# Patient Record
Sex: Female | Born: 1945 | Race: White | Hispanic: No | Marital: Married | Smoking: Never smoker
Health system: Southern US, Community
[De-identification: ages and names within clinical notes are randomized; demographics above are authoritative.]

## PROBLEM LIST (undated history)

## (undated) DIAGNOSIS — M1990 Unspecified osteoarthritis, unspecified site: Secondary | ICD-10-CM

## (undated) DIAGNOSIS — Z9861 Coronary angioplasty status: Secondary | ICD-10-CM

## (undated) DIAGNOSIS — I2510 Atherosclerotic heart disease of native coronary artery without angina pectoris: Secondary | ICD-10-CM

## (undated) DIAGNOSIS — E785 Hyperlipidemia, unspecified: Secondary | ICD-10-CM

## (undated) DIAGNOSIS — I10 Essential (primary) hypertension: Secondary | ICD-10-CM

## (undated) HISTORY — DX: Atherosclerotic heart disease of native coronary artery without angina pectoris: I25.10

## (undated) HISTORY — DX: Coronary angioplasty status: Z98.61

## (undated) HISTORY — PX: CARDIAC CATHETERIZATION: SHX172

## (undated) HISTORY — DX: Essential (primary) hypertension: I10

## (undated) HISTORY — DX: Hyperlipidemia, unspecified: E78.5

---

## 1999-03-22 ENCOUNTER — Other Ambulatory Visit: Admission: RE | Admit: 1999-03-22 | Discharge: 1999-03-22 | Payer: Self-pay | Admitting: *Deleted

## 1999-06-13 ENCOUNTER — Ambulatory Visit (HOSPITAL_COMMUNITY): Admission: AD | Admit: 1999-06-13 | Discharge: 1999-06-13 | Payer: Self-pay | Admitting: Cardiovascular Disease

## 1999-06-29 ENCOUNTER — Encounter: Payer: Self-pay | Admitting: Surgery

## 1999-07-03 ENCOUNTER — Encounter: Payer: Self-pay | Admitting: Surgery

## 1999-07-03 ENCOUNTER — Inpatient Hospital Stay (HOSPITAL_COMMUNITY): Admission: RE | Admit: 1999-07-03 | Discharge: 1999-07-07 | Payer: Self-pay | Admitting: Surgery

## 1999-07-04 ENCOUNTER — Encounter: Payer: Self-pay | Admitting: Surgery

## 1999-07-05 ENCOUNTER — Encounter: Payer: Self-pay | Admitting: Surgery

## 1999-10-31 ENCOUNTER — Encounter (HOSPITAL_COMMUNITY): Admission: RE | Admit: 1999-10-31 | Discharge: 2000-01-29 | Payer: Self-pay | Admitting: Cardiovascular Disease

## 2000-04-15 ENCOUNTER — Other Ambulatory Visit: Admission: RE | Admit: 2000-04-15 | Discharge: 2000-04-15 | Payer: Self-pay | Admitting: *Deleted

## 2000-09-04 ENCOUNTER — Encounter: Payer: Self-pay | Admitting: Emergency Medicine

## 2000-09-04 ENCOUNTER — Emergency Department (HOSPITAL_COMMUNITY): Admission: EM | Admit: 2000-09-04 | Discharge: 2000-09-04 | Payer: Self-pay | Admitting: Emergency Medicine

## 2001-05-08 ENCOUNTER — Other Ambulatory Visit: Admission: RE | Admit: 2001-05-08 | Discharge: 2001-05-08 | Payer: Self-pay | Admitting: *Deleted

## 2001-06-12 ENCOUNTER — Encounter (INDEPENDENT_AMBULATORY_CARE_PROVIDER_SITE_OTHER): Payer: Self-pay

## 2001-06-12 ENCOUNTER — Other Ambulatory Visit: Admission: RE | Admit: 2001-06-12 | Discharge: 2001-06-12 | Payer: Self-pay | Admitting: *Deleted

## 2003-10-20 ENCOUNTER — Ambulatory Visit (HOSPITAL_COMMUNITY): Admission: RE | Admit: 2003-10-20 | Discharge: 2003-10-21 | Payer: Self-pay | Admitting: Cardiovascular Disease

## 2006-10-02 ENCOUNTER — Ambulatory Visit (HOSPITAL_COMMUNITY): Admission: RE | Admit: 2006-10-02 | Discharge: 2006-10-03 | Payer: Self-pay | Admitting: Cardiovascular Disease

## 2006-10-30 ENCOUNTER — Emergency Department (HOSPITAL_COMMUNITY): Admission: EM | Admit: 2006-10-30 | Discharge: 2006-10-30 | Payer: Self-pay | Admitting: Emergency Medicine

## 2009-05-17 ENCOUNTER — Other Ambulatory Visit: Admission: RE | Admit: 2009-05-17 | Discharge: 2009-05-17 | Payer: Self-pay | Admitting: Family Medicine

## 2010-10-24 ENCOUNTER — Ambulatory Visit: Payer: Self-pay | Admitting: Cardiovascular Disease

## 2011-01-10 ENCOUNTER — Ambulatory Visit: Payer: Self-pay | Admitting: Cardiovascular Disease

## 2011-01-10 ENCOUNTER — Telehealth (INDEPENDENT_AMBULATORY_CARE_PROVIDER_SITE_OTHER): Payer: Self-pay | Admitting: *Deleted

## 2011-01-11 ENCOUNTER — Ambulatory Visit: Admission: RE | Admit: 2011-01-11 | Discharge: 2011-01-11 | Payer: Self-pay | Source: Home / Self Care

## 2011-01-11 ENCOUNTER — Encounter (HOSPITAL_COMMUNITY)
Admission: RE | Admit: 2011-01-11 | Discharge: 2011-01-16 | Payer: Self-pay | Source: Home / Self Care | Attending: Cardiovascular Disease | Admitting: Cardiovascular Disease

## 2011-01-11 ENCOUNTER — Encounter: Payer: Self-pay | Admitting: *Deleted

## 2011-01-11 ENCOUNTER — Encounter: Payer: Self-pay | Admitting: Cardiology

## 2011-01-18 ENCOUNTER — Ambulatory Visit (INDEPENDENT_AMBULATORY_CARE_PROVIDER_SITE_OTHER): Payer: Medicare Other | Admitting: Cardiovascular Disease

## 2011-01-18 DIAGNOSIS — E7800 Pure hypercholesterolemia, unspecified: Secondary | ICD-10-CM

## 2011-01-18 DIAGNOSIS — Z951 Presence of aortocoronary bypass graft: Secondary | ICD-10-CM

## 2011-01-18 DIAGNOSIS — R079 Chest pain, unspecified: Secondary | ICD-10-CM

## 2011-01-18 NOTE — Assessment & Plan Note (Signed)
Summary: Cardiology Nuclear Testing  Nuclear Med Background Indications for Stress Test: Evaluation for Ischemia, Graft Patency, Stent Patency   History: Angioplasty, CABG, GXT, Heart Catheterization, Myocardial Perfusion Study, Stents  History Comments: '04 GXT '04 Angioplasty > LAD Stent '05 MPS '07 Heart Cath: Patent Stent/Graft  Symptoms: Chest Pain with Exertion, Fatigue, Palpitations    Nuclear Pre-Procedure Cardiac Risk Factors: Family History - CAD, Hypertension, Lipids Caffeine/Decaff Intake: None NPO After: 4:30 AM Lungs: clear IV 0.9% NS with Angio Cath: 22g     IV Site: R Hand IV Started by: Bonnita Levan, RN Chest Size (in) 36     Cup Size A     Height (in): 64 Weight (lb): 154 BMI: 26.53  Nuclear Med Study 1 or 2 day study:  1 day     Stress Test Type:  Adenosine Reading MD:  Cassell Clement, MD     Referring MD:  P.Nahser Resting Radionuclide:  Technetium 32m Tetrofosmin     Resting Radionuclide Dose:  11.0 mCi  Stress Radionuclide:  Technetium 43m Tetrofosmin     Stress Radionuclide Dose:  33.0 mCi   Stress Protocol  Dose of Adenosine:  39.2 mg    Stress Test Technologist:  Milana Na, EMT-P     Nuclear Technologist:  Domenic Polite, CNMT  Rest Procedure  Myocardial perfusion imaging was performed at rest 45 minutes following the intravenous administration of Technetium 54m Tetrofosmin.  Stress Procedure  The patient received IV adenosine at 140 mcg/kg/min for 4 minutes. There were no significant changes and freq pvcs with infusion. Technetium 24m Tetrofosmin was injected at the 2 minute mark and quantitative spect images were obtained after a 45 minute delay.  QPS Raw Data Images:  Normal; no motion artifact; normal heart/lung ratio. Stress Images:  Normal homogeneous uptake in all areas of the myocardium. Rest Images:  Normal homogeneous uptake in all areas of the myocardium. Subtraction (SDS):  No evidence of ischemia. Transient Ischemic  Dilatation:  0.92  (Normal <1.22)  Lung/Heart Ratio:  0.40  (Normal <0.45)  Quantitative Gated Spect Images QGS EDV:  83 ml QGS ESV:  20 ml QGS EF:  75 %  Findings Normal nuclear study      Overall Impression  Exercise Capacity: Lexiscan with no exercise. BP Response: Normal blood pressure response. Clinical Symptoms: Mild chest pain/dyspnea. ECG Impression: LBBB Overall Impression: Normal stress nuclear study. Overall Impression Comments: Vigorous LV systolic function.  No wall motion abnormalities.  Appended Document: Cardiology Nuclear Testing cpoy send to Dr.nasher

## 2011-01-18 NOTE — Progress Notes (Signed)
Summary: Nuclear Pre-Procedure  Phone Note Outgoing Call Call back at [facility] Phone [phone]   Call placed by: Stanton Kidney, EMT-P,  January 10, 2011 2:37 PM Action Taken: Phone Call Completed Summary of Call: Left message with information on My[employer] Information Sheet (see scanned document for details). Stanton Kidney, EMT-P  January 10, 2011 2:39 PM     Nuclear Med Background Indications for Stress Test: Evaluation for Ischemia, Graft Patency, Stent Patency   History: Angioplasty, CABG, GXT, Heart Catheterization, Myocardial Perfusion Study, Stents  History Comments: '04 GXT '04 Angioplasty > LAD Stent '05 MPS '07 Heart Cath: Patent Stent/Graft  Symptoms: Chest Pain with Exertion    Nuclear Pre-Procedure Cardiac Risk Factors: Family History - CAD, Hypertension, Lipids

## 2011-02-05 ENCOUNTER — Ambulatory Visit: Payer: Self-pay | Admitting: Cardiovascular Disease

## 2011-02-13 ENCOUNTER — Other Ambulatory Visit (INDEPENDENT_AMBULATORY_CARE_PROVIDER_SITE_OTHER): Payer: Medicare Other

## 2011-02-13 DIAGNOSIS — I2510 Atherosclerotic heart disease of native coronary artery without angina pectoris: Secondary | ICD-10-CM

## 2011-02-13 DIAGNOSIS — E7800 Pure hypercholesterolemia, unspecified: Secondary | ICD-10-CM

## 2011-02-13 DIAGNOSIS — I4949 Other premature depolarization: Secondary | ICD-10-CM

## 2011-02-15 ENCOUNTER — Ambulatory Visit (INDEPENDENT_AMBULATORY_CARE_PROVIDER_SITE_OTHER): Payer: Medicare Other | Admitting: Cardiovascular Disease

## 2011-02-15 DIAGNOSIS — I2510 Atherosclerotic heart disease of native coronary artery without angina pectoris: Secondary | ICD-10-CM

## 2011-02-15 DIAGNOSIS — E7800 Pure hypercholesterolemia, unspecified: Secondary | ICD-10-CM

## 2011-02-15 DIAGNOSIS — Z951 Presence of aortocoronary bypass graft: Secondary | ICD-10-CM

## 2011-03-14 ENCOUNTER — Emergency Department (HOSPITAL_COMMUNITY): Payer: Medicare Other

## 2011-03-14 ENCOUNTER — Emergency Department (HOSPITAL_COMMUNITY)
Admission: EM | Admit: 2011-03-14 | Discharge: 2011-03-14 | Disposition: A | Discharge status: Home / Self Care | Payer: Medicare Other | Attending: Emergency Medicine | Admitting: Emergency Medicine

## 2011-03-14 DIAGNOSIS — S0003XA Contusion of scalp, initial encounter: Secondary | ICD-10-CM | POA: Insufficient documentation

## 2011-03-14 DIAGNOSIS — IMO0002 Reserved for concepts with insufficient information to code with codable children: Secondary | ICD-10-CM | POA: Insufficient documentation

## 2011-03-14 DIAGNOSIS — S060X0A Concussion without loss of consciousness, initial encounter: Secondary | ICD-10-CM | POA: Insufficient documentation

## 2011-03-14 DIAGNOSIS — E785 Hyperlipidemia, unspecified: Secondary | ICD-10-CM | POA: Insufficient documentation

## 2011-03-14 DIAGNOSIS — R413 Other amnesia: Secondary | ICD-10-CM | POA: Insufficient documentation

## 2011-03-14 DIAGNOSIS — I10 Essential (primary) hypertension: Secondary | ICD-10-CM | POA: Insufficient documentation

## 2011-03-14 DIAGNOSIS — W1809XA Striking against other object with subsequent fall, initial encounter: Secondary | ICD-10-CM | POA: Insufficient documentation

## 2011-03-14 DIAGNOSIS — R51 Headache: Secondary | ICD-10-CM | POA: Insufficient documentation

## 2011-05-04 NOTE — Discharge Summary (Signed)
Erin Marshall, Erin Marshall NO.:  [id number]   MEDICAL RECORD NO.:  [id number]          PATIENT TYPE:  OIB   LOCATION:  2009                         FACILITY:  [facility]   PHYSICIAN:  Vesta Mixer, M.D. DATE OF BIRTH:  1946/04/09   DATE OF ADMISSION:  10/02/2006  DATE OF DISCHARGE:  10/03/2006                                 DISCHARGE SUMMARY   DISCHARGE DIAGNOSES:  1. Noncardiac chest pain.  2. History of coronary artery disease - status post coronary artery bypass      grafting.  3. Hyperlipidemia.  4. Hypertension.   DISCHARGE MEDICATIONS:  1. Atenolol 25 mg a day.  2. Vytorin 10 mg/20 mg once a day.  3. Diovan HCT 80 mg/12.5 mg a day.  4. Aspirin 325 mg a day.  5. Calcium with vitamin D 600 mg a day.  6. Multivitamin once a day.  7. Nitroglycerin as needed.   DISPOSITION:  The patient will see Dr. Elease Hashimoto in several months.  She is to  call for any worsening problems.   HISTORY:  [name] is a 65 year old female who was admitted for heart  catheterization for some episodes of chest discomfort.  Please see dictated  H&P for further details.   HOSPITAL COURSE BY PROBLEMS.:  Chest pain.  The patient had a heart  catheterization.  She was found to have no significant irregularities.  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  We had placed a stent in her  proximal LAD several years ago in an effort to get blood out to her diagonal  distribution.  Following the heart catheterization, she had a groin bleed.  It was very difficult to stop the bleeding, and we ended up having to put a  FemoStop on her.  Because of this, she was kept overnight for observation.  This morning, her groin is quite stable.  She will be discharged on the  above-noted medications and disposition.  We will see her back in several  months.           ______________________________  Vesta Mixer,  M.D.     PJN/MEDQ  D:  10/03/2006  T:  10/04/2006  Job:  [id number]

## 2011-05-04 NOTE — Cardiovascular Report (Signed)
[name], FORKER NO.:  [id number]   MEDICAL RECORD NO.:  [id number]          PATIENT TYPE:  OIB   LOCATION:  2899                         FACILITY:  [facility]   PHYSICIAN:  Vesta Mixer, M.D. DATE OF BIRTH:  Dec 08, 1946   DATE OF PROCEDURE:  10/02/2006  DATE OF DISCHARGE:                              CARDIAC CATHETERIZATION   Erin Marshall is a 65 year old female with a history of coronary artery  disease.  She is status post coronary artery bypass grafting.  She is also  status post PTCA and stent placement to the proximal LAD several years ago.   She presents with symptoms of chest pain.   The procedure was left heart catheterization with coronary angiography.   The right femoral artery was easily cannulated using a modified Seldinger  technique.   HEMODYNAMICS:  LV pressure was 151/10 with an aortic pressure of 153/79.   ANGIOGRAPHY:  Left main:  The left main is fairly normal.   The left anterior descending artery has mild irregularities proximally.  The  proximal stent is widely patent.  The LAD gives off a very large first  diagonal branch which is normal.  The true LAD is somewhat smaller than the  first diagonal branch.  It is known that the IMA insertion on the smaller  distal LAD.   The flow down the distal LAD is well-preserved.   The ramus intermediate branch is relatively small but is occluded.   The left circumflex artery is very large and is dominant.  The first obtuse  marginal artery is occluded.  There is a large posterolateral branch which  is normal.  There most likely is a second obtuse marginal artery which is  occluded but it is not seen in these films.   The right coronary artery is small and is nondominant.   The saphenous vein graft to the ramus intermediate branch is normal.  The  vessel is fairly small.   The saphenous vein graft to the two marginal branches is a normal graft.  The first of the marginal branches is a  very small vessel with very limited  flow.  Anastomosis looks good but the native vessel is very tiny.  The  second marginal branch is fairly normal.  There is a normal anastomosis.   The left inch sternal mammary artery is very atretic.  There is some TIMI  grade 1 to 2 flow down to the distal LAD.   There is some flow down the distal LAD but this was supplied mostly by the  native circulation.   The left ventriculogram was performed in a __________ RAO position.  Reveals  normal left ventricular systolic function.  Ejection fraction is  approximately 60-65%.  There is no complications.   IMPRESSION AND PLAN:  1. Coronary artery disease.  She has a patent stent to the left anterior      descending artery.  This now supplies the diagonal vessel and the true      LAD.  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  She has patent      saphenous vein graft to the intermediate branch and to the obtuse      marginal branches.  We will continue with medical therapy.           ______________________________  Vesta Mixer, M.D.     PJN/MEDQ  D:  10/02/2006  T:  10/03/2006  Job:  [id number]

## 2011-05-04 NOTE — H&P (Signed)
NAME:  MERIN, BORJON NO.:  [id number]   MEDICAL RECORD NO.:  [id number]            PATIENT TYPE:   LOCATION:                                 FACILITY:   PHYSICIAN:  Vesta Mixer, M.D.      DATE OF BIRTH:   DATE OF ADMISSION:  DATE OF DISCHARGE:                                HISTORY & PHYSICAL   Erin Marshall is a middle-aged female with a history of coronary artery  disease and hyperlipidemia.  She is status post CABG.  She has overall done  fairly well until recently.  She presented on October 12 with some episodes  of chest discomfort.  These episodes of chest discomfort occurred at various  times but also were exacerbated by exercise.  The pain radiated through to  her back.  The pain would last several minutes.  It was incompletely  relieved with sublingual nitroglycerin.   She has not been doing much in the way of exercise.   She denies any syncope or presyncope.   CURRENT MEDICATIONS:  1. Enteric-coated aspirin 325 mg daily.  2. Calcium once daily.  3. Multivitamin once daily.  4. Atenolol 25 mg daily.  5. Vytorin 10 mg /20 mg once daily.  6. Diovan HCT 80 mg/12.5 mg once daily.   ALLERGIES:  SHE IS ALLERGIC TO VICODIN WHICH CAUSES NAUSEA, VOMITING;  PERCOCET WHICH CAUSES NAUSEA, VOMITING; AND DARVOCET WHICH CAUSES NAUSEA,  VOMITING.   PAST MEDICAL HISTORY:  1. Coronary artery disease.  She is status post coronary artery bypass      grafting.  2. Dyslipidemia.   SOCIAL HISTORY:  The patient is a nonsmoker.  She does not drink alcohol to  excess.   FAMILY HISTORY:  The patient's father died at age 76 due to coronary artery  disease.  He had coronary artery bypass grafting.  Her mother has a history  of arrhythmias.   REVIEW OF SYSTEMS:  Reviewed and is essentially negative.   PHYSICAL EXAMINATION:  GENERAL:  On exam, she is a middle-aged female in no  acute distress.  She is alert and oriented x3 and her mood and affect are  normal.  VITAL SIGNS:  Her weight is 153, blood pressure is 152/90, with a heart rate  of 74.  HEENT:  Exam reveals 2+ carotids.  She has no bruits, no JVD, no  thyromegaly.  LUNGS:  Clear to auscultation.  HEART:  Regular rate, S1-S2, with no murmurs.  ABDOMEN:  Abdominal exam reveals good bowel sounds and is nontender.  EXTREMITIES:  She has no clubbing, cyanosis, or edema.  NEURO EXAM:  Nonfocal.   Her EKG reveals normal sinus rhythm.  She has left bundle-branch block.   LABORATORY DATA:  Pending.   Erin Marshall presents with some episodes of chest pain with radiation through to  her back.  Her symptoms sound consistent with unstable angina.  I would like  to proceed with heart catheterization.  We explained the risks, benefits,  and options of heart catheterization.  She understands and agrees to  proceed.  All of her other medical problems are stable.           ______________________________  Vesta Mixer, M.D.     PJN/MEDQ  D:  09/29/2006  T:  09/29/2006  Job:  [id number]   cc:   L. Lupe Carney, M.D.

## 2011-05-29 ENCOUNTER — Other Ambulatory Visit: Payer: Self-pay | Admitting: Cardiovascular Disease

## 2011-05-29 NOTE — Telephone Encounter (Signed)
Med refill

## 2011-07-06 ENCOUNTER — Other Ambulatory Visit: Payer: Self-pay | Admitting: Cardiovascular Disease

## 2011-07-06 NOTE — Telephone Encounter (Signed)
Fax received from pharmacy. Refill completed. Jodette Shaye Elling RN  

## 2011-08-07 ENCOUNTER — Encounter: Payer: Self-pay | Admitting: Cardiovascular Disease

## 2011-08-21 ENCOUNTER — Ambulatory Visit (INDEPENDENT_AMBULATORY_CARE_PROVIDER_SITE_OTHER): Payer: Medicare Other | Admitting: *Deleted

## 2011-08-21 DIAGNOSIS — I10 Essential (primary) hypertension: Secondary | ICD-10-CM

## 2011-08-21 DIAGNOSIS — E785 Hyperlipidemia, unspecified: Secondary | ICD-10-CM

## 2011-08-21 LAB — BASIC METABOLIC PANEL
Calcium: 9.9 mg/dL (ref 8.4–10.5)
Creatinine, Ser: 0.9 mg/dL (ref 0.4–1.2)
GFR: 65.43 mL/min (ref >60.00)
Glucose, Bld: 87 mg/dL (ref 70–99)
Sodium: 140 mEq/L (ref 135–145)

## 2011-08-21 LAB — LIPID PANEL
HDL: 49.2 mg/dL (ref >39.00)
Triglycerides: 100 mg/dL (ref 0.0–149.0)
VLDL: 20 mg/dL (ref 0.0–40.0)

## 2011-08-21 LAB — HEPATIC FUNCTION PANEL
Albumin: 4.1 g/dL (ref 3.5–5.2)
Alkaline Phosphatase: 34 U/L — ABNORMAL LOW (ref 39–117)
Total Protein: 7.4 g/dL (ref 6.0–8.3)

## 2011-08-23 ENCOUNTER — Encounter: Payer: Self-pay | Admitting: Cardiovascular Disease

## 2011-08-23 ENCOUNTER — Ambulatory Visit (INDEPENDENT_AMBULATORY_CARE_PROVIDER_SITE_OTHER): Payer: Medicare Other | Admitting: Cardiovascular Disease

## 2011-08-23 VITALS — BP 130/80 | HR 64 | Ht 62.0 in | Wt 156.0 lb

## 2011-08-23 DIAGNOSIS — I2510 Atherosclerotic heart disease of native coronary artery without angina pectoris: Secondary | ICD-10-CM

## 2011-08-23 MED ORDER — ISOSORBIDE MONONITRATE ER 30 MG PO TB24: 30.0000 mg | ORAL_TABLET | Freq: Every day | ORAL | Status: DC

## 2011-08-23 NOTE — Assessment & Plan Note (Signed)
She seems to be doing well from a cardiac standpoint. We will continue with her same medications. I've refilled her Imdur on a 90 day prescription.

## 2011-08-23 NOTE — Progress Notes (Signed)
Erin Marshall Date of Birth  22-Jun-1946 [facility] / [facility] [address]     [address] [place], [place]  [address] [phone]  Fax  [phone]  History of Present Illness:  65 year old female with a history of coronary artery disease feet she status post coronary artery bypass grafting in 2000 with stenting of the LAD in 2004. 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  She's done well since I last saw her. She's not had any episodes of chest pain or shortness of breath.    Current Outpatient Prescriptions on File Prior to Visit  Medication Sig Dispense Refill  . aspirin 325 MG EC tablet Take 325 mg by mouth daily.        Marland Kitchen atenolol (TENORMIN) 25 MG tablet TAKE 1 TABLET EVERY DAY  90 tablet  1  . atorvastatin (LIPITOR) 40 MG tablet Take 40 mg by mouth daily.        Marland Kitchen BENICAR HCT 20-12.5 MG per tablet TAKE 1 TABLET EVERY DAY  90 tablet  3  . CALCIUM PO Take by mouth daily.        . fish oil-omega-3 fatty acids 1000 MG capsule Take 2 g by mouth daily.        . isosorbide mononitrate (IMDUR) 30 MG 24 hr tablet Take 30 mg by mouth daily.       . Multiple Vitamin (MULTIVITAMIN) tablet Take 1 tablet by mouth daily.          Allergies  Allergen Reactions  . Amoxicillin   . Oxycodone-Acetaminophen   . Propoxyphene N-Acetaminophen   . Vicodin (Hydrocodone-Acetaminophen)     Past Medical History  Diagnosis Date  . Coronary artery disease     Status post CABG, July 03, 1999  . Post PTCA     Status post PTCA and stenting of the proximal LAD-4 LIMA to LAD graft has become atretic after stenting of the proximal LAD , 2004  . Hyperlipidemia   . Hypertension     Past Surgical History  Procedure Date  . Cardiac catheterization     Ejection Fraction is 60-65%    History  Smoking status  . Never Smoker   Smokeless tobacco  . Not on file    History  Alcohol Use  . Yes    Family History  Problem Relation Age of Onset  . Heart  failure Father   . Stroke Sister     Reviw of Systems:  Reviewed in the HPI.  All other systems are negative.  Physical Exam: BP 130/80  Pulse 64  Ht 5\' 2"  (1.575 m)  Wt 156 lb (70.761 kg)  BMI 28.53 kg/m2 The patient is alert and oriented x 3.  The mood and affect are normal.   Skin: warm and dry.  Color is normal.    HEENT:   the sclera are nonicteric.  The mucous membranes are moist.  The carotids are 2+ without bruits.  There is no thyromegaly.  There is no JVD.    Lungs: clear.  The chest wall is non tender.    Heart: regular rate with a normal S1 and S2.  There are no murmurs, gallops, or rubs. The PMI is not displaced.     Abdomen: good bowel sounds.  There is no guarding or rebound.  There is no hepatosplenomegaly or tenderness.  There are no masses.   Extremities:  no clubbing, cyanosis, or edema.  The legs are without rashes.  The distal pulses are intact.   Neuro:  Cranial nerves II - XII are intact.  Motor and sensory functions are intact.    The gait is normal.  ECG:  Assessment / Plan:

## 2011-10-31 ENCOUNTER — Other Ambulatory Visit: Payer: Self-pay | Admitting: Cardiology

## 2011-12-31 ENCOUNTER — Other Ambulatory Visit: Payer: Self-pay | Admitting: *Deleted

## 2011-12-31 MED ORDER — ATENOLOL 25 MG PO TABS: 25.0000 mg | ORAL_TABLET | Freq: Every day | ORAL | Status: DC

## 2011-12-31 NOTE — Telephone Encounter (Signed)
Fax Received. Refill Completed. Erin Marshall (R.M.A)   

## 2012-03-28 DIAGNOSIS — M169 Osteoarthritis of hip, unspecified: Secondary | ICD-10-CM | POA: Diagnosis not present

## 2012-03-28 DIAGNOSIS — M76899 Other specified enthesopathies of unspecified lower limb, excluding foot: Secondary | ICD-10-CM | POA: Diagnosis not present

## 2012-04-02 ENCOUNTER — Telehealth: Payer: Self-pay | Admitting: Cardiovascular Disease

## 2012-04-02 NOTE — Telephone Encounter (Signed)
New msg Pt has questions about upcoming procedure and meds she takes. Please call her back

## 2012-04-02 NOTE — Telephone Encounter (Signed)
Pt calling stating she has steroid injections to hip coming up and wanted to know if she could hold ASA and Fish Oil for 5 days preceding procedure--advised OK to hold meds for 5 days--nt

## 2012-04-15 DIAGNOSIS — M169 Osteoarthritis of hip, unspecified: Secondary | ICD-10-CM | POA: Diagnosis not present

## 2012-04-25 DIAGNOSIS — H6120 Impacted cerumen, unspecified ear: Secondary | ICD-10-CM | POA: Diagnosis not present

## 2012-05-06 ENCOUNTER — Telehealth: Payer: Self-pay | Admitting: Cardiovascular Disease

## 2012-05-06 DIAGNOSIS — M169 Osteoarthritis of hip, unspecified: Secondary | ICD-10-CM | POA: Diagnosis not present

## 2012-05-06 NOTE — Telephone Encounter (Signed)
BENICAR HCT REFILL NEEDED [facility] [address], [PT OUT PHARMACY REQUESTED TWICE

## 2012-05-07 ENCOUNTER — Telehealth: Payer: Self-pay | Admitting: Cardiovascular Disease

## 2012-05-07 MED ORDER — OLMESARTAN MEDOXOMIL-HCTZ 20-12.5 MG PO TABS: 1.0000 | ORAL_TABLET | Freq: Every day | ORAL | Status: DC

## 2012-05-07 NOTE — Telephone Encounter (Signed)
New msg Pt is calling about surgical clearance for hip surgery. She has it scheduled in August but she wants it moved up since she is having a lot of pain. She wants to know if she needs an appt with Dr Elease Hashimoto Please call

## 2012-05-07 NOTE — Telephone Encounter (Signed)
Patient called, stated she will be having hip surgery 07/28/12 but is on the cancellation list and hopefully she will have done sooner.Patient wanted Dr.Nasher's nurse to know Dr.Alusio's office will be faxing a clearance letter to be signed by Dr.Nahser and faxed back.Fowarded to Dr.Nahser's nurse.

## 2012-05-07 NOTE — Telephone Encounter (Signed)
Refilled medication

## 2012-05-08 NOTE — Telephone Encounter (Signed)
Noted  

## 2012-05-15 ENCOUNTER — Encounter: Payer: Self-pay | Admitting: *Deleted

## 2012-05-15 NOTE — Telephone Encounter (Signed)
Paperwork sent back to [facility]. Pt informed. Given to [employer].

## 2012-06-23 ENCOUNTER — Other Ambulatory Visit: Payer: Self-pay | Admitting: *Deleted

## 2012-06-23 MED ORDER — ATENOLOL 25 MG PO TABS: 25.0000 mg | ORAL_TABLET | Freq: Every day | ORAL | Status: DC

## 2012-06-23 NOTE — Telephone Encounter (Signed)
Pt needs appointment then refill can be made Fax Received. Refill Completed. Erin Marshall (R.M.A)   

## 2012-07-11 DIAGNOSIS — M169 Osteoarthritis of hip, unspecified: Secondary | ICD-10-CM | POA: Diagnosis not present

## 2012-07-11 NOTE — H&P (Signed)
Carrington Clamp DOB: 04-14-1946  Chief Complaint: left total hip arthroplasty  History of Present Illness The patient is a 66 year old female who comes in today for a preoperative History and Physical. The patient is scheduled for a left total hip arthroplasty to be performed by Dr. Gus Rankin. Aluisio, MD at [facility] on Monday July 28, 2012 . The patient is being followed for their left hip pain and osteoarthritis. Unfortunately, everything is getting worse in the left hip. The pain is in the groin and lateral hip radiating down the thigh. Her motion is becoming more limited. She is able to do less. She had the intraarticular hip injection without much benefit. At this point, the most predictable means of improving pain and function would be total hip arthroplasty.    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  Vertigo Shingles Impaired Vision Hypertension Heart Disease Measles. as a child Mumps. as a child   Allergies Codeine/Codeine Derivatives- nausea   Family History Father. deceased age 69 due to heart failure Mother. deceased age 62 due to stroke; breast cancer   Social History No alcohol use Tobacco use. Never smoker. Post-Surgical Plans. home with family Advance Directives. living will, healthcare POA   Medication History Norco (5-325MG  Tablet, 1 Tablet Oral four times daily, as needed Active. Atenolol (25MG  Tablet, Oral) Active. Atorvastatin Calcium (80MG  Tablet, Oral) Active. Benicar HCT (20-12.5MG  Tablet, Oral) Active. Isosorbide Mononitrate ER (30MG  Tablet ER 24HR, Oral) Active. Nitro ( Oral) Specific dose unknown - Active. Aspirin EC (325MG  Tablet DR, Oral) Active. Calcium-Vitamin D (600MG  Tablet Chewable, Oral) Active. Multiple Vitamin ( Oral) Active. Fish Oil (1000MG  Capsule DR, Oral two times daily)  Active.   Past Surgical History Coronary Artery Bypass, Single Heart Stents    Review of Systems General:Not Present- Chills, Fever, Night Sweats, Fatigue, Weight Gain, Weight Loss and Memory Loss. Skin:Not Present- Hives, Itching, Rash, Eczema and Lesions. HEENT:Present- Tinnitus and Dentures (partial). Not Present- Headache, Double Vision, Visual Loss and Hearing Loss. Respiratory:Present- Allergies. Not Present- Shortness of breath with exertion, Shortness of breath at rest, Coughing up blood and Chronic Cough. Cardiovascular:Present- Murmur. Not Present- Chest Pain, Racing/skipping heartbeats, Difficulty Breathing Lying Down, Swelling and Palpitations. Gastrointestinal:Not Present- Bloody Stool, Heartburn, Abdominal Pain, Vomiting, Nausea, Constipation, Diarrhea, Difficulty Swallowing, Jaundice and Loss of appetitie. Female Genitourinary:Not Present- Blood in Urine, Urinary frequency, Weak urinary stream, Discharge, Flank Pain, Incontinence, Painful Urination, Urgency, Urinary Retention and Urinating at Night. Musculoskeletal:Present- Joint Pain and Morning Stiffness. Not Present- Muscle Weakness, Muscle Pain, Joint Swelling, Back Pain and Spasms. Neurological:Not Present- Tremor, Dizziness, Blackout spells, Paralysis, Difficulty with balance and Weakness. Psychiatric:Not Present- Insomnia.   Vitals Weight: 157 lb Height: 63 in Body Surface Area: 1.78 m Body Mass Index: 27.81 kg/m Pulse: 76 (Regular) Resp.: 18 (Unlabored) BP: 138/82 (Sitting, Left Arm, Standard)    Physical Exam General Mental Status - Alert, cooperative and good historian. General Appearance- pleasant. Not in acute distress. Orientation- Oriented X3. Build & Nutrition- Well nourished and Well developed. Head and Neck Head- normocephalic, atraumatic . Neck Global Assessment- supple. no bruit auscultated on the right and no bruit auscultated on the left. Eye Pupil-  Bilateral- Regular and Round. Motion- Bilateral- EOMI. Chest and Lung Exam Auscultation: Breath sounds:- clear at anterior chest wall and - clear at posterior chest wall. Adventitious sounds:- No Adventitious sounds. Cardiovascular Auscultation:Rhythm- Regular rate and rhythm. Heart Sounds- S1 WNL and S2 WNL. Murmurs &  Other Heart Sounds: Murmur 1:Timing- Early systolic. Grade- III/VI. Abdomen Palpation/Percussion:Tenderness- Abdomen is non-tender to palpation. Rigidity (guarding)- Abdomen is soft. Auscultation:Auscultation of the abdomen reveals - Bowel sounds normal. Female Genitourinary Not done, not pertinent to present illness Peripheral Vascular Upper Extremity: Palpation:- Pulses bilaterally normal. Lower Extremity: Palpation:- Pulses bilaterally normal. Musculoskeletal Examination of the right hip, normal range of motion, no discomfort. Examination of the left hip, flexed to 95 degrees, no internal rotation, about 10 degrees external rotation, 10 to 20 degrees of abduction. Examination of the knees, normal. Pulses, sensation and motor are intact. Gait pattern is antalgic on the left.   RADIOGRAPHS: Radiographs are reviewed from the patient's last visit. AP pelvis and lateral hip and she has bone-on-bone arthritis of the hip.     Assessment & Plan Osteoarthritis, Hip (715.35) Left total hip arthroplasty      Dimitri Ped, PA-C

## 2012-07-14 ENCOUNTER — Other Ambulatory Visit: Payer: Self-pay | Admitting: Orthopedic Surgery

## 2012-07-14 MED ORDER — BUPIVACAINE LIPOSOME 1.3 % IJ SUSP: 20.0000 mL | Freq: Once | INTRAMUSCULAR | Status: DC

## 2012-07-14 MED ORDER — DEXAMETHASONE SODIUM PHOSPHATE 10 MG/ML IJ SOLN: 10.0000 mg | Freq: Once | INTRAMUSCULAR | Status: DC

## 2012-07-14 NOTE — Progress Notes (Signed)
Preoperative surgical orders have been place into the Epic hospital system for Erin Marshall on 07/14/2012, 11:57 AM  by Patrica Duel for surgery on 07/28/12.  Preop Total Hip orders including Experel Injecion, IV Tylenol, and IV Decadron as long as there are no contraindications to the above medications. Avel Peace, PA-C

## 2012-07-16 ENCOUNTER — Encounter (HOSPITAL_COMMUNITY): Payer: Self-pay | Admitting: Pharmacy Technician

## 2012-07-21 ENCOUNTER — Encounter (HOSPITAL_COMMUNITY): Payer: Self-pay

## 2012-07-21 ENCOUNTER — Other Ambulatory Visit: Payer: Self-pay | Admitting: *Deleted

## 2012-07-21 ENCOUNTER — Other Ambulatory Visit: Payer: Self-pay | Admitting: Orthopedic Surgery

## 2012-07-21 ENCOUNTER — Ambulatory Visit (HOSPITAL_COMMUNITY)
Admission: RE | Admit: 2012-07-21 | Discharge: 2012-07-21 | Disposition: A | Discharge status: Home / Self Care | Payer: Medicare Other | Source: Ambulatory Visit | Attending: Orthopedic Surgery | Admitting: Orthopedic Surgery

## 2012-07-21 ENCOUNTER — Encounter (HOSPITAL_COMMUNITY)
Admission: RE | Admit: 2012-07-21 | Discharge: 2012-07-21 | Disposition: A | Discharge status: Home / Self Care | Payer: Medicare Other | Source: Ambulatory Visit | Attending: Orthopedic Surgery | Admitting: Orthopedic Surgery

## 2012-07-21 DIAGNOSIS — M6289 Other specified disorders of muscle: Secondary | ICD-10-CM | POA: Diagnosis not present

## 2012-07-21 DIAGNOSIS — Z01818 Encounter for other preprocedural examination: Secondary | ICD-10-CM | POA: Diagnosis not present

## 2012-07-21 DIAGNOSIS — Z01812 Encounter for preprocedural laboratory examination: Secondary | ICD-10-CM | POA: Insufficient documentation

## 2012-07-21 DIAGNOSIS — Z01811 Encounter for preprocedural respiratory examination: Secondary | ICD-10-CM | POA: Diagnosis not present

## 2012-07-21 DIAGNOSIS — Z01810 Encounter for preprocedural cardiovascular examination: Secondary | ICD-10-CM | POA: Insufficient documentation

## 2012-07-21 DIAGNOSIS — M259 Joint disorder, unspecified: Secondary | ICD-10-CM | POA: Diagnosis not present

## 2012-07-21 DIAGNOSIS — M1990 Unspecified osteoarthritis, unspecified site: Secondary | ICD-10-CM

## 2012-07-21 DIAGNOSIS — I447 Left bundle-branch block, unspecified: Secondary | ICD-10-CM | POA: Diagnosis not present

## 2012-07-21 HISTORY — DX: Unspecified osteoarthritis, unspecified site: M19.90

## 2012-07-21 HISTORY — PX: CORONARY ARTERY BYPASS GRAFT: SHX141

## 2012-07-21 LAB — COMPREHENSIVE METABOLIC PANEL
ALT: 42 U/L — ABNORMAL HIGH (ref 0–35)
Alkaline Phosphatase: 37 U/L — ABNORMAL LOW (ref 39–117)
CO2: 26 mEq/L (ref 19–32)
Calcium: 10.3 mg/dL (ref 8.4–10.5)
GFR calc Af Amer: 85 mL/min — ABNORMAL LOW (ref >90)
GFR calc non Af Amer: 73 mL/min — ABNORMAL LOW (ref >90)
Glucose, Bld: 92 mg/dL (ref 70–99)
Sodium: 140 mEq/L (ref 135–145)

## 2012-07-21 LAB — SURGICAL PCR SCREEN: Staphylococcus aureus: NEGATIVE

## 2012-07-21 LAB — URINALYSIS, ROUTINE W REFLEX MICROSCOPIC
Glucose, UA: NEGATIVE mg/dL
Hgb urine dipstick: NEGATIVE
Ketones, ur: NEGATIVE mg/dL
Protein, ur: NEGATIVE mg/dL

## 2012-07-21 LAB — CBC
HCT: 36.9 % (ref 36.0–46.0)
Hemoglobin: 13.1 g/dL (ref 12.0–15.0)
MCH: 32.9 pg (ref 26.0–34.0)
MCV: 92.7 fL (ref 78.0–100.0)
RBC: 3.98 MIL/uL (ref 3.87–5.11)

## 2012-07-21 MED ORDER — NITROGLYCERIN 0.4 MG SL SUBL: 0.4000 mg | SUBLINGUAL_TABLET | SUBLINGUAL | Status: DC | PRN

## 2012-07-21 NOTE — Telephone Encounter (Signed)
Fax Received. Refill Completed. Taeja Debellis Chowoe (R.M.A)   

## 2012-07-21 NOTE — Patient Instructions (Addendum)
20 Erin Marshall  07/21/2012   Your procedure is scheduled on:   07-28-2012  Report to [facility] at     1130   AM.  Call this number if you have problems the morning of surgery: [phone]/ or Presurgical Testing [phone] prior   Remember:   Do not eat food:After Midnight.  May have clear liquids:up to 6 Hours before arrival. Nothing after :  Clear liquids include soda, tea, black coffee, apple or grape juice, broth. 0800 AM  Take these medicines the morning of surgery with A SIP OF WATER: Atenolol, Hydrocodone, Isosorbide.   Do not wear jewelry, make-up or nail polish.  Do not wear lotions, powders, or perfumes. You may wear deodorant.  Do not shave 48 hours prior to surgery.(face and neck okay, no shaving of legs)  Do not bring valuables to the hospital.  Contacts, dentures or bridgework may not be worn into surgery.  Leave suitcase in the car. After surgery it may be brought to your room.  For patients admitted to the hospital, checkout time is 11:00 AM the day of discharge.   Patients discharged the day of surgery will not be allowed to drive home.  Name and phone number of your driver: spouse  Special Instructions: CHG Shower Use Special Wash: 1/2 bottle night before surgery and 1/2 bottle morning of surgery.(avoid face and genitals)   Please read over the following fact sheets that you were given: MRSA Information, Blood Transfusion fact sheet, Incentive Spirometry Instruction.

## 2012-07-21 NOTE — Pre-Procedure Instructions (Addendum)
07-21-12 EKG/ CXR today. 07-21-12 Lab viewable in Epic result of CMP, CBc, PT.PTT faxed to office of Dr. Lequita Halt. 07-24-12 1745 Pt. Made aware of change in surgery time to 12:35pm , to arrive by 1000 am , stop Clear Liquids(56mn-0600am)., then nothing.Erin Marshall

## 2012-07-27 NOTE — Anesthesia Preprocedure Evaluation (Addendum)
Anesthesia Evaluation  Patient identified by MRN, date of birth, ID band Patient awake    Reviewed: Allergy & Precautions, H&P , NPO status , Patient's Chart, lab work & pertinent test results, reviewed documented beta blocker date and time   Airway Mallampati: II TM Distance: >3 FB Neck ROM: Full    Dental  (+) Teeth Intact and Dental Advisory Given   Pulmonary neg pulmonary ROS,  breath sounds clear to auscultation  Pulmonary exam normal       Cardiovascular Exercise Tolerance: Good hypertension, Pt. on home beta blockers and Pt. on medications + CAD, + Cardiac Stents and + CABG Rhythm:Regular Rate:Normal     Neuro/Psych negative neurological ROS     GI/Hepatic negative GI ROS,   Endo/Other  negative endocrine ROS  Renal/GU negative Renal ROS     Musculoskeletal  (+) Arthritis -, Osteoarthritis,    Abdominal (+)  Abdomen: soft.    Peds  Hematology negative hematology ROS (+)   Anesthesia Other Findings   Reproductive/Obstetrics                         Anesthesia Physical Anesthesia Plan  ASA: III  Anesthesia Plan: General   Post-op Pain Management:    Induction: Intravenous  Airway Management Planned: Oral ETT  Additional Equipment:   Intra-op Plan:   Post-operative Plan:   Informed Consent: I have reviewed the patients History and Physical, chart, labs and discussed the procedure including the risks, benefits and alternatives for the proposed anesthesia with the patient or authorized representative who has indicated his/her understanding and acceptance.   Dental advisory given  Plan Discussed with: CRNA and Surgeon  Anesthesia Plan Comments:         Anesthesia Quick Evaluation

## 2012-07-28 ENCOUNTER — Encounter (HOSPITAL_COMMUNITY): Payer: Self-pay | Admitting: Anesthesiology

## 2012-07-28 ENCOUNTER — Encounter (HOSPITAL_COMMUNITY)
Admission: RE | Disposition: A | Discharge status: Home Health | Payer: Self-pay | Source: Ambulatory Visit | Attending: Orthopedic Surgery

## 2012-07-28 ENCOUNTER — Inpatient Hospital Stay (HOSPITAL_COMMUNITY): Payer: Medicare Other | Admitting: Anesthesiology

## 2012-07-28 ENCOUNTER — Encounter (HOSPITAL_COMMUNITY): Payer: Self-pay | Admitting: *Deleted

## 2012-07-28 ENCOUNTER — Inpatient Hospital Stay (HOSPITAL_COMMUNITY)
Admission: RE | Admit: 2012-07-28 | Discharge: 2012-07-31 | DRG: 470 | Disposition: A | Discharge status: Home Health | Payer: Medicare Other | Source: Ambulatory Visit | Attending: Orthopedic Surgery | Admitting: Orthopedic Surgery

## 2012-07-28 ENCOUNTER — Inpatient Hospital Stay (HOSPITAL_COMMUNITY): Payer: Medicare Other

## 2012-07-28 DIAGNOSIS — M1610 Unilateral primary osteoarthritis, unspecified hip: Principal | ICD-10-CM | POA: Diagnosis present

## 2012-07-28 DIAGNOSIS — I2510 Atherosclerotic heart disease of native coronary artery without angina pectoris: Secondary | ICD-10-CM | POA: Diagnosis present

## 2012-07-28 DIAGNOSIS — Z96649 Presence of unspecified artificial hip joint: Secondary | ICD-10-CM | POA: Diagnosis not present

## 2012-07-28 DIAGNOSIS — Z9289 Personal history of other medical treatment: Secondary | ICD-10-CM

## 2012-07-28 DIAGNOSIS — E785 Hyperlipidemia, unspecified: Secondary | ICD-10-CM | POA: Diagnosis present

## 2012-07-28 DIAGNOSIS — M5137 Other intervertebral disc degeneration, lumbosacral region: Secondary | ICD-10-CM | POA: Diagnosis present

## 2012-07-28 DIAGNOSIS — Z79899 Other long term (current) drug therapy: Secondary | ICD-10-CM | POA: Diagnosis not present

## 2012-07-28 DIAGNOSIS — E871 Hypo-osmolality and hyponatremia: Secondary | ICD-10-CM | POA: Diagnosis not present

## 2012-07-28 DIAGNOSIS — M169 Osteoarthritis of hip, unspecified: Secondary | ICD-10-CM | POA: Diagnosis not present

## 2012-07-28 DIAGNOSIS — M2240 Chondromalacia patellae, unspecified knee: Secondary | ICD-10-CM | POA: Diagnosis present

## 2012-07-28 DIAGNOSIS — D62 Acute posthemorrhagic anemia: Secondary | ICD-10-CM | POA: Diagnosis not present

## 2012-07-28 DIAGNOSIS — I10 Essential (primary) hypertension: Secondary | ICD-10-CM | POA: Diagnosis present

## 2012-07-28 DIAGNOSIS — M76899 Other specified enthesopathies of unspecified lower limb, excluding foot: Secondary | ICD-10-CM | POA: Diagnosis present

## 2012-07-28 DIAGNOSIS — Z951 Presence of aortocoronary bypass graft: Secondary | ICD-10-CM | POA: Diagnosis not present

## 2012-07-28 DIAGNOSIS — Z471 Aftercare following joint replacement surgery: Secondary | ICD-10-CM | POA: Diagnosis not present

## 2012-07-28 DIAGNOSIS — M51379 Other intervertebral disc degeneration, lumbosacral region without mention of lumbar back pain or lower extremity pain: Secondary | ICD-10-CM | POA: Diagnosis present

## 2012-07-28 HISTORY — PX: TOTAL HIP ARTHROPLASTY: SHX124

## 2012-07-28 LAB — ABO/RH: ABO/RH(D): B POS

## 2012-07-28 SURGERY — ARTHROPLASTY, HIP, TOTAL,POSTERIOR APPROACH
Anesthesia: General | Site: Hip | Laterality: Left | Wound class: Clean

## 2012-07-28 MED ORDER — FENTANYL CITRATE 0.05 MG/ML IJ SOLN
INTRAMUSCULAR | Status: AC
  Filled 2012-07-28: qty 2

## 2012-07-28 MED ORDER — FENTANYL CITRATE 0.05 MG/ML IJ SOLN
INTRAMUSCULAR | Status: DC | PRN
  Administered 2012-07-28 (×3): 50 ug via INTRAVENOUS
  Administered 2012-07-28: 100 ug via INTRAVENOUS

## 2012-07-28 MED ORDER — IRBESARTAN 150 MG PO TABS
150.0000 mg | ORAL_TABLET | Freq: Every day | ORAL | Status: DC
  Filled 2012-07-28: qty 1

## 2012-07-28 MED ORDER — PHENOL 1.4 % MT LIQD: 1.0000 | OROMUCOSAL | Status: DC | PRN

## 2012-07-28 MED ORDER — TRAMADOL HCL 50 MG PO TABS
50.0000 mg | ORAL_TABLET | Freq: Four times a day (QID) | ORAL | Status: DC | PRN
  Administered 2012-07-30 – 2012-07-31 (×3): 100 mg via ORAL
  Filled 2012-07-28 (×3): qty 2

## 2012-07-28 MED ORDER — POLYETHYLENE GLYCOL 3350 17 G PO PACK: 17.0000 g | PACK | Freq: Every day | ORAL | Status: DC | PRN

## 2012-07-28 MED ORDER — ACETAMINOPHEN 10 MG/ML IV SOLN
INTRAVENOUS | Status: AC
  Filled 2012-07-28: qty 100

## 2012-07-28 MED ORDER — METOCLOPRAMIDE HCL 5 MG/ML IJ SOLN
5.0000 mg | Freq: Three times a day (TID) | INTRAMUSCULAR | Status: DC | PRN
  Administered 2012-07-28: 10 mg via INTRAVENOUS
  Filled 2012-07-28: qty 2

## 2012-07-28 MED ORDER — SODIUM CHLORIDE 0.9 % IV SOLN: INTRAVENOUS | Status: DC

## 2012-07-28 MED ORDER — LACTATED RINGERS IV SOLN
INTRAVENOUS | Status: DC
  Administered 2012-07-28: 14:00:00 via INTRAVENOUS
  Administered 2012-07-28: 1000 mL via INTRAVENOUS

## 2012-07-28 MED ORDER — DIPHENHYDRAMINE HCL 12.5 MG/5ML PO ELIX: 12.5000 mg | ORAL_SOLUTION | ORAL | Status: DC | PRN

## 2012-07-28 MED ORDER — ACETAMINOPHEN 10 MG/ML IV SOLN: 1000.0000 mg | Freq: Once | INTRAVENOUS | Status: DC

## 2012-07-28 MED ORDER — METHOCARBAMOL 100 MG/ML IJ SOLN
500.0000 mg | Freq: Four times a day (QID) | INTRAVENOUS | Status: DC | PRN
  Administered 2012-07-28 (×2): 500 mg via INTRAVENOUS
  Filled 2012-07-28 (×2): qty 5

## 2012-07-28 MED ORDER — ROCURONIUM BROMIDE 100 MG/10ML IV SOLN
INTRAVENOUS | Status: DC | PRN
  Administered 2012-07-28: 40 mg via INTRAVENOUS

## 2012-07-28 MED ORDER — ACETAMINOPHEN 325 MG PO TABS: 650.0000 mg | ORAL_TABLET | Freq: Four times a day (QID) | ORAL | Status: DC | PRN

## 2012-07-28 MED ORDER — ACETAMINOPHEN 10 MG/ML IV SOLN
1000.0000 mg | Freq: Four times a day (QID) | INTRAVENOUS | Status: AC
  Administered 2012-07-28 – 2012-07-29 (×4): 1000 mg via INTRAVENOUS
  Filled 2012-07-28 (×6): qty 100

## 2012-07-28 MED ORDER — HYDROMORPHONE HCL PF 1 MG/ML IJ SOLN
INTRAMUSCULAR | Status: DC | PRN
  Administered 2012-07-28 (×2): 1 mg via INTRAVENOUS

## 2012-07-28 MED ORDER — ISOSORBIDE MONONITRATE ER 30 MG PO TB24
30.0000 mg | ORAL_TABLET | Freq: Every morning | ORAL | Status: DC
  Administered 2012-07-29 – 2012-07-31 (×2): 30 mg via ORAL
  Filled 2012-07-28 (×3): qty 1

## 2012-07-28 MED ORDER — PROPOFOL 10 MG/ML IV BOLUS
INTRAVENOUS | Status: DC | PRN
  Administered 2012-07-28: 150 mg via INTRAVENOUS

## 2012-07-28 MED ORDER — DEXAMETHASONE SODIUM PHOSPHATE 10 MG/ML IJ SOLN
INTRAMUSCULAR | Status: DC | PRN
  Administered 2012-07-28: 10 mg via INTRAVENOUS

## 2012-07-28 MED ORDER — MEPERIDINE HCL 50 MG/ML IJ SOLN: 6.2500 mg | INTRAMUSCULAR | Status: DC | PRN

## 2012-07-28 MED ORDER — NEOSTIGMINE METHYLSULFATE 1 MG/ML IJ SOLN
INTRAMUSCULAR | Status: DC | PRN
  Administered 2012-07-28: 4 mg via INTRAVENOUS

## 2012-07-28 MED ORDER — PROMETHAZINE HCL 25 MG/ML IJ SOLN: 6.2500 mg | INTRAMUSCULAR | Status: DC | PRN

## 2012-07-28 MED ORDER — BISACODYL 10 MG RE SUPP: 10.0000 mg | Freq: Every day | RECTAL | Status: DC | PRN

## 2012-07-28 MED ORDER — ONDANSETRON HCL 4 MG/2ML IJ SOLN: 4.0000 mg | Freq: Four times a day (QID) | INTRAMUSCULAR | Status: DC | PRN

## 2012-07-28 MED ORDER — RIVAROXABAN 10 MG PO TABS
10.0000 mg | ORAL_TABLET | Freq: Every day | ORAL | Status: DC
  Administered 2012-07-29 – 2012-07-31 (×3): 10 mg via ORAL
  Filled 2012-07-28 (×5): qty 1

## 2012-07-28 MED ORDER — FENTANYL CITRATE 0.05 MG/ML IJ SOLN
25.0000 ug | INTRAMUSCULAR | Status: DC | PRN
  Administered 2012-07-28 (×3): 50 ug via INTRAVENOUS

## 2012-07-28 MED ORDER — ACETAMINOPHEN 10 MG/ML IV SOLN
INTRAVENOUS | Status: DC | PRN
  Administered 2012-07-28: 1000 mg via INTRAVENOUS

## 2012-07-28 MED ORDER — METOCLOPRAMIDE HCL 10 MG PO TABS: 5.0000 mg | ORAL_TABLET | Freq: Three times a day (TID) | ORAL | Status: DC | PRN

## 2012-07-28 MED ORDER — ATENOLOL 25 MG PO TABS
25.0000 mg | ORAL_TABLET | Freq: Every morning | ORAL | Status: DC
  Administered 2012-07-29 – 2012-07-31 (×2): 25 mg via ORAL
  Filled 2012-07-28 (×3): qty 1

## 2012-07-28 MED ORDER — HYDROMORPHONE HCL 2 MG PO TABS
2.0000 mg | ORAL_TABLET | ORAL | Status: DC | PRN
  Administered 2012-07-28: 2 mg via ORAL
  Administered 2012-07-29 (×6): 4 mg via ORAL
  Filled 2012-07-28: qty 1
  Filled 2012-07-28 (×6): qty 2

## 2012-07-28 MED ORDER — HYDROCHLOROTHIAZIDE 12.5 MG PO CAPS
12.5000 mg | ORAL_CAPSULE | Freq: Every day | ORAL | Status: DC
  Administered 2012-07-29 – 2012-07-31 (×2): 12.5 mg via ORAL
  Filled 2012-07-28 (×3): qty 1

## 2012-07-28 MED ORDER — KETAMINE HCL 10 MG/ML IJ SOLN: 10.0000 mg | Freq: Once | INTRAMUSCULAR | Status: DC

## 2012-07-28 MED ORDER — ACETAMINOPHEN 10 MG/ML IV SOLN: 1000.0000 mg | Freq: Once | INTRAVENOUS | Status: DC | PRN

## 2012-07-28 MED ORDER — HYDROMORPHONE HCL PF 1 MG/ML IJ SOLN
INTRAMUSCULAR | Status: AC
  Filled 2012-07-28: qty 1

## 2012-07-28 MED ORDER — GLYCOPYRROLATE 0.2 MG/ML IJ SOLN
INTRAMUSCULAR | Status: DC | PRN
  Administered 2012-07-28: .8 mg via INTRAVENOUS

## 2012-07-28 MED ORDER — METHOCARBAMOL 500 MG PO TABS
500.0000 mg | ORAL_TABLET | Freq: Four times a day (QID) | ORAL | Status: DC | PRN
  Administered 2012-07-29 – 2012-07-31 (×5): 500 mg via ORAL
  Filled 2012-07-28 (×5): qty 1

## 2012-07-28 MED ORDER — FLEET ENEMA 7-19 GM/118ML RE ENEM: 1.0000 | ENEMA | Freq: Once | RECTAL | Status: AC | PRN

## 2012-07-28 MED ORDER — MIDAZOLAM HCL 2 MG/2ML IJ SOLN: 1.0000 mg | Freq: Once | INTRAMUSCULAR | Status: DC

## 2012-07-28 MED ORDER — ACETAMINOPHEN 650 MG RE SUPP: 650.0000 mg | Freq: Four times a day (QID) | RECTAL | Status: DC | PRN

## 2012-07-28 MED ORDER — NITROGLYCERIN 0.4 MG SL SUBL: 0.4000 mg | SUBLINGUAL_TABLET | SUBLINGUAL | Status: DC | PRN

## 2012-07-28 MED ORDER — HYDROMORPHONE HCL PF 1 MG/ML IJ SOLN
0.2500 mg | INTRAMUSCULAR | Status: DC | PRN
  Administered 2012-07-28 (×3): 0.25 mg via INTRAVENOUS

## 2012-07-28 MED ORDER — HYDROMORPHONE HCL PF 1 MG/ML IJ SOLN
0.5000 mg | INTRAMUSCULAR | Status: DC | PRN
  Administered 2012-07-28: 1 mg via INTRAVENOUS
  Administered 2012-07-28 – 2012-07-29 (×2): 0.5 mg via INTRAVENOUS
  Administered 2012-07-29 (×2): 1 mg via INTRAVENOUS
  Filled 2012-07-28 (×4): qty 1

## 2012-07-28 MED ORDER — VANCOMYCIN HCL IN DEXTROSE 1-5 GM/200ML-% IV SOLN: 1000.0000 mg | INTRAVENOUS | Status: DC

## 2012-07-28 MED ORDER — 0.9 % SODIUM CHLORIDE (POUR BTL) OPTIME
TOPICAL | Status: DC | PRN
  Administered 2012-07-28: 1000 mL

## 2012-07-28 MED ORDER — CEFAZOLIN SODIUM 1-5 GM-% IV SOLN
1.0000 g | Freq: Four times a day (QID) | INTRAVENOUS | Status: AC
  Administered 2012-07-28 – 2012-07-29 (×2): 1 g via INTRAVENOUS
  Filled 2012-07-28 (×2): qty 50

## 2012-07-28 MED ORDER — CHLORHEXIDINE GLUCONATE 4 % EX LIQD
60.0000 mL | Freq: Once | CUTANEOUS | Status: DC
  Filled 2012-07-28: qty 60

## 2012-07-28 MED ORDER — ATORVASTATIN CALCIUM 40 MG PO TABS
40.0000 mg | ORAL_TABLET | Freq: Every morning | ORAL | Status: DC
  Administered 2012-07-29 – 2012-07-31 (×3): 40 mg via ORAL
  Filled 2012-07-28 (×3): qty 1

## 2012-07-28 MED ORDER — BUPIVACAINE LIPOSOME 1.3 % IJ SUSP
20.0000 mL | Freq: Once | INTRAMUSCULAR | Status: AC
  Administered 2012-07-28: 20 mL
  Filled 2012-07-28: qty 20

## 2012-07-28 MED ORDER — CEFAZOLIN SODIUM-DEXTROSE 2-3 GM-% IV SOLR
INTRAVENOUS | Status: DC | PRN
  Administered 2012-07-28: 2 g via INTRAVENOUS

## 2012-07-28 MED ORDER — DOCUSATE SODIUM 100 MG PO CAPS
100.0000 mg | ORAL_CAPSULE | Freq: Two times a day (BID) | ORAL | Status: DC
  Administered 2012-07-28 – 2012-07-31 (×5): 100 mg via ORAL

## 2012-07-28 MED ORDER — ONDANSETRON HCL 4 MG PO TABS: 4.0000 mg | ORAL_TABLET | Freq: Four times a day (QID) | ORAL | Status: DC | PRN

## 2012-07-28 MED ORDER — MIDAZOLAM HCL 5 MG/5ML IJ SOLN
INTRAMUSCULAR | Status: DC | PRN
  Administered 2012-07-28: 2 mg via INTRAVENOUS

## 2012-07-28 MED ORDER — ONDANSETRON HCL 4 MG/2ML IJ SOLN
INTRAMUSCULAR | Status: DC | PRN
  Administered 2012-07-28: 4 mg via INTRAVENOUS

## 2012-07-28 MED ORDER — LIDOCAINE HCL (CARDIAC) 20 MG/ML IV SOLN
INTRAVENOUS | Status: DC | PRN
  Administered 2012-07-28: 50 mg via INTRAVENOUS

## 2012-07-28 MED ORDER — OLMESARTAN MEDOXOMIL-HCTZ 20-12.5 MG PO TABS: 1.0000 | ORAL_TABLET | Freq: Every morning | ORAL | Status: DC

## 2012-07-28 MED ORDER — MENTHOL 3 MG MT LOZG: 1.0000 | LOZENGE | OROMUCOSAL | Status: DC | PRN

## 2012-07-28 SURGICAL SUPPLY — 52 items
BAG SPEC THK2 15X12 ZIP CLS (MISCELLANEOUS)
BAG ZIPLOCK 12X15 (MISCELLANEOUS) ×1 IMPLANT
BIT DRILL 2.8X128 (BIT) ×2 IMPLANT
BLADE EXTENDED COATED 6.5IN (ELECTRODE) ×2 IMPLANT
BLADE SAW SAG 73X25 THK (BLADE) ×1
BLADE SAW SGTL 73X25 THK (BLADE) ×1 IMPLANT
CLOTH BEACON ORANGE TIMEOUT ST (SAFETY) ×2 IMPLANT
CLSR STERI-STRIP ANTIMIC 1/2X4 (GAUZE/BANDAGES/DRESSINGS) ×1 IMPLANT
DECANTER SPIKE VIAL GLASS SM (MISCELLANEOUS) ×2 IMPLANT
DRAPE INCISE IOBAN 66X45 STRL (DRAPES) ×2 IMPLANT
DRAPE ORTHO SPLIT 77X108 STRL (DRAPES) ×4
DRAPE POUCH INSTRU U-SHP 10X18 (DRAPES) ×2 IMPLANT
DRAPE SURG ORHT 6 SPLT 77X108 (DRAPES) ×2 IMPLANT
DRAPE U-SHAPE 47X51 STRL (DRAPES) ×2 IMPLANT
DRSG ADAPTIC 3X8 NADH LF (GAUZE/BANDAGES/DRESSINGS) ×2 IMPLANT
DRSG MEPILEX BORDER 4X4 (GAUZE/BANDAGES/DRESSINGS) ×2 IMPLANT
DRSG MEPILEX BORDER 4X8 (GAUZE/BANDAGES/DRESSINGS) ×2 IMPLANT
DURAPREP 26ML APPLICATOR (WOUND CARE) ×2 IMPLANT
ELECT REM PT RETURN 9FT ADLT (ELECTROSURGICAL) ×2
ELECTRODE REM PT RTRN 9FT ADLT (ELECTROSURGICAL) ×1 IMPLANT
EVACUATOR 1/8 PVC DRAIN (DRAIN) ×2 IMPLANT
FACESHIELD LNG OPTICON STERILE (SAFETY) ×8 IMPLANT
GLOVE BIO SURGEON STRL SZ7.5 (GLOVE) ×2 IMPLANT
GLOVE BIO SURGEON STRL SZ8 (GLOVE) ×2 IMPLANT
GLOVE BIOGEL PI IND STRL 8 (GLOVE) ×2 IMPLANT
GLOVE BIOGEL PI INDICATOR 8 (GLOVE) ×2
GOWN STRL NON-REIN LRG LVL3 (GOWN DISPOSABLE) ×2 IMPLANT
GOWN STRL REIN XL XLG (GOWN DISPOSABLE) ×2 IMPLANT
IMMOBILIZER KNEE 20 (SOFTGOODS) ×2
IMMOBILIZER KNEE 20 THIGH 36 (SOFTGOODS) IMPLANT
KIT BASIN OR (CUSTOM PROCEDURE TRAY) ×2 IMPLANT
MANIFOLD NEPTUNE II (INSTRUMENTS) ×2 IMPLANT
NDL SAFETY ECLIPSE 18X1.5 (NEEDLE) ×1 IMPLANT
NEEDLE HYPO 18GX1.5 SHARP (NEEDLE) ×2
NS IRRIG 1000ML POUR BTL (IV SOLUTION) ×2 IMPLANT
PACK TOTAL JOINT (CUSTOM PROCEDURE TRAY) ×2 IMPLANT
PASSER SUT SWANSON 36MM LOOP (INSTRUMENTS) ×2 IMPLANT
POSITIONER SURGICAL ARM (MISCELLANEOUS) ×2 IMPLANT
SPONGE GAUZE 4X4 12PLY (GAUZE/BANDAGES/DRESSINGS) ×2 IMPLANT
STRIP CLOSURE SKIN 1/2X4 (GAUZE/BANDAGES/DRESSINGS) ×4 IMPLANT
SUT ETHIBOND NAB CT1 #1 30IN (SUTURE) ×4 IMPLANT
SUT MNCRL AB 4-0 PS2 18 (SUTURE) ×2 IMPLANT
SUT VIC AB 1 CT1 27 (SUTURE)
SUT VIC AB 1 CT1 27XBRD ANTBC (SUTURE) ×2 IMPLANT
SUT VIC AB 2-0 CT1 27 (SUTURE) ×6
SUT VIC AB 2-0 CT1 TAPERPNT 27 (SUTURE) ×3 IMPLANT
SUT VLOC 180 0 24IN GS25 (SUTURE) ×4 IMPLANT
SYR 50ML LL SCALE MARK (SYRINGE) ×2 IMPLANT
TOWEL OR 17X26 10 PK STRL BLUE (TOWEL DISPOSABLE) ×4 IMPLANT
TOWEL OR NON WOVEN STRL DISP B (DISPOSABLE) ×2 IMPLANT
TRAY FOLEY CATH 14FRSI W/METER (CATHETERS) ×2 IMPLANT
WATER STERILE IRR 1500ML POUR (IV SOLUTION) ×2 IMPLANT

## 2012-07-28 NOTE — Interval H&P Note (Signed)
History and Physical Interval Note:  07/28/2012 12:37 PM  Erin Marshall  has presented today for surgery, with the diagnosis of Osteoarthritis of the Left Hip  The various methods of treatment have been discussed with the patient and family. After consideration of risks, benefits and other options for treatment, the patient has consented to  Procedure(s) (LRB): TOTAL HIP ARTHROPLASTY (Left) as a surgical intervention .  The patient's history has been reviewed, patient examined, no change in status, stable for surgery.  I have reviewed the patient's chart and labs.  Questions were answered to the patient's satisfaction.     Loanne Drilling

## 2012-07-28 NOTE — Transfer of Care (Signed)
Immediate Anesthesia Transfer of Care Note  Patient: Erin Marshall  Procedure(s) Performed: Procedure(s) (LRB): TOTAL HIP ARTHROPLASTY (Left)  Patient Location: PACU  Anesthesia Type: General  Level of Consciousness: sedated, patient cooperative and responds to stimulaton  Airway & Oxygen Therapy: Patient Spontanous Breathing and Patient connected to face mask oxgen  Post-op Assessment: Report given to PACU RN and Post -op Vital signs reviewed and stable  Post vital signs: Reviewed and stable  Complications: No apparent anesthesia complications

## 2012-07-28 NOTE — Op Note (Signed)
Pre-operative diagnosis- Osteoarthritis Left hip  Post-operative diagnosis- Osteoarthritis  Left hip  Procedure-  LeftTotal Hip Arthroplasty  Surgeon- Gus Rankin. Markey Deady, MD  Assistant- Dimitri Ped, PA-C   Anesthesia  General  EBL- 300   Drain Hemovac   Complication- None  Condition-PACU - hemodynamically stable.   Brief Clinical Note-  Erin Marshall is a 66 y.o. female with end stage arthritis of her left hip with progressively worsening pain and dysfunction. Pain occurs with activity and rest including pain at night. She has tried analgesics, protected weight bearing and rest without benefit. Pain is too severe to attempt physical therapy. Radiographs demonstrate bone on bone arthritis with subchondral cyst formation. She presents now for left THA.  Procedure in detail-   The patient is brought into the operating room and placed on the operating table. After successful administration of General  anesthesia, the patient is placed in the  Right lateral decubitus position with the  Left side up and held in place with the hip positioner. The lower extremity is isolated from the perineum with plastic drapes and time-out is performed by the surgical team. The lower extremity is then prepped and draped in the usual sterile fashion. A short posterolateral incision is made with a ten blade through the subcutaneous tissue to the level of the fascia lata which is incised in line with the skin incision. The sciatic nerve is palpated and protected and the short external rotators and capsule are isolated from the femur. The hip is then dislocated and the center of the femoral head is marked. A trial prosthesis is placed such that the trial head corresponds to the center of the patients' native femoral head. The resection level is marked on the femoral neck and the resection is made with an oscillating saw. The femoral head is removed and femoral retractors placed to gain access to the femoral canal.     The canal finder is passed into the femoral canal and the canal is thoroughly irrigated with sterile saline to remove the fatty contents. Axial reaming is performed to 11.5  mm, proximal reaming to 16D  and the sleeve machined to a large. A 16D large trial sleeve is placed into the proximal femur.      The femur is then retracted anteriorly to gain acetabular exposure. Acetabular retractors are placed and the labrum and osteophytes are removed, Acetabular reaming is performed to 49  mm and a 50  mm Pinnacle acetabular shell is placed in anatomic position with excellent purchase. Additional dome screws were placed. An apex hole eliminator is placed and the permanent 32 mm neutral + 4 Marathon liner is placed into the acetabular shell.      The trial femur is then placed into the femoral canal. The size is 16 x 11  stem with a 36 + 6  neck and a 32 + 0 head with the neck version matching  the patients' native anteversion. The hip is reduced with excellent stability with full extension and full external rotation, 70 degrees flexion with 40 degrees adduction and 90 degrees internal rotation and 90 degrees of flexion with 70 degrees of internal rotation. The operative leg is placed on top of the non-operative leg and the leg lengths are found to be equal. The trials are then removed and the permanent implant of the same size is impacted into the femoral canal. The  Ceramic femoral head of the same size as the trial is placed and the hip is reduced  with the same stability parameters. The operative leg is again placed on top of the non-operative leg and the leg lengths are found to be equal.      The wound is then copiously irrigated with saline solution and the capsule and short external rotators are re-attached to the femur through drill holes with Ethibond suture. The fascia lata is closed over a hemovac drain with #1 vicryl suture and the fascia lata, gluteal muscles and subcutaneous tissues are injected with  Exparel 20ml diluted with saline 50ml. The subcutaneous tissues are closed with #1 and2-0 vicryl and the subcuticular layer closed with running 4-0 Monocryl. The drain is hooked to suction, incision cleaned and dried, and steri-srips and a bulky sterile dressing applied. The limb is placed into a knee immobilizer and the patient is awakened and transported to recovery in stable condition.      Please note that a surgical assistant was a medical necessity for this procedure in order to perform it in a safe and expeditious manner. The assistant was necessary to provide retraction to the vital neurovascular structures and to retract and position the limb to allow for anatomic placement of the prosthetic components.  Gus Rankin Elleanna Melling, MD    07/28/2012, 2:11 PM

## 2012-07-28 NOTE — Plan of Care (Signed)
Problem: Consults Goal: Diagnosis- Total Joint Replacement Left total hip     

## 2012-07-29 DIAGNOSIS — E871 Hypo-osmolality and hyponatremia: Secondary | ICD-10-CM | POA: Diagnosis not present

## 2012-07-29 LAB — CBC
MCV: 90.4 fL (ref 78.0–100.0)
Platelets: 209 10*3/uL (ref 150–400)
RBC: 3.12 MIL/uL — ABNORMAL LOW (ref 3.87–5.11)
WBC: 13 10*3/uL — ABNORMAL HIGH (ref 4.0–10.5)

## 2012-07-29 LAB — BASIC METABOLIC PANEL
CO2: 21 mEq/L (ref 19–32)
Chloride: 97 mEq/L (ref 96–112)
Creatinine, Ser: 0.8 mg/dL (ref 0.50–1.10)
Potassium: 4.5 mEq/L (ref 3.5–5.1)

## 2012-07-29 MED ORDER — IRBESARTAN 150 MG PO TABS
150.0000 mg | ORAL_TABLET | Freq: Every day | ORAL | Status: DC
  Administered 2012-07-29 – 2012-07-31 (×2): 150 mg via ORAL
  Filled 2012-07-29 (×3): qty 1

## 2012-07-29 MED ORDER — SODIUM CHLORIDE 0.9 % IV BOLUS (SEPSIS)
250.0000 mL | Freq: Once | INTRAVENOUS | Status: AC
  Administered 2012-07-29: 250 mL via INTRAVENOUS

## 2012-07-29 MED ORDER — SODIUM CHLORIDE 0.9 % IV SOLN
INTRAVENOUS | Status: DC
  Administered 2012-07-29 (×2): via INTRAVENOUS

## 2012-07-29 MED ORDER — ZOLPIDEM TARTRATE 5 MG PO TABS
5.0000 mg | ORAL_TABLET | Freq: Every evening | ORAL | Status: DC | PRN
  Administered 2012-07-29: 5 mg via ORAL
  Filled 2012-07-29: qty 1

## 2012-07-29 NOTE — Progress Notes (Signed)
Physical Therapy Treatment Patient Details Name: FRANCENA ZENDER MRN: [id number] DOB: 1946-08-25 Today's Date: 07/29/2012 Time: 2725-3664 PT Time Calculation (min): 19 min  PT Assessment / Plan / Recommendation Comments on Treatment Session  Marked improvement in activity tolerance and greatly decreased anxiety vs this am    Follow Up Recommendations  Home health PT    Barriers to Discharge        Equipment Recommendations  Rolling walker with 5" wheels    Recommendations for Other Services OT consult  Frequency 7X/week   Plan Discharge plan remains appropriate    Precautions / Restrictions Precautions Precautions: Posterior Hip Restrictions Weight Bearing Restrictions: No Other Position/Activity Restrictions: WBAT   Pertinent Vitals/Pain 4/10    Mobility  Bed Mobility Bed Mobility: Sit to Supine Sit to Supine: 3: Mod assist Details for Bed Mobility Assistance: cues for sequence and use of R LE and UEs to self assist Transfers Transfers: Sit to Stand;Stand to Sit Sit to Stand: 4: Min assist;3: Mod assist Stand to Sit: 3: Mod assist;4: Min assist Details for Transfer Assistance: cues for use of UEs to self assist and for LE management and adherence to THP Ambulation/Gait Ambulation/Gait Assistance: 4: Min assist;3: Mod assist Ambulation Distance (Feet): 98 Feet Assistive device: Rolling walker Ambulation/Gait Assistance Details: cues for sequence, posture, position from RW and ER on L Gait Pattern: Step-to pattern    Exercises     PT Diagnosis:    PT Problem List:   PT Treatment Interventions:     PT Goals Acute Rehab PT Goals PT Goal Formulation: With patient Time For Goal Achievement: 08/04/12 Potential to Achieve Goals: Good Pt will go Supine/Side to Sit: with supervision PT Goal: Supine/Side to Sit - Progress: Goal set today Pt will go Sit to Supine/Side: with supervision PT Goal: Sit to Supine/Side - Progress: Goal set today Pt will go Sit to Stand:  with supervision PT Goal: Sit to Stand - Progress: Progressing toward goal Pt will go Stand to Sit: with supervision PT Goal: Stand to Sit - Progress: Progressing toward goal Pt will Ambulate: 51 - 150 feet;with supervision;with rolling walker PT Goal: Ambulate - Progress: Progressing toward goal Pt will Go Up / Down Stairs: 3-5 stairs;with min assist;with least restrictive assistive device PT Goal: Up/Down Stairs - Progress: Goal set today  Visit Information  Last PT Received On: 07/29/12 Assistance Needed: +1    Subjective Data  Subjective: I'm doing much better Patient Stated Goal: Resume previous lifestyle with decreased pain   Cognition  Overall Cognitive Status: Appears within functional limits for tasks assessed/performed Arousal/Alertness: Awake/alert Orientation Level: Appears intact for tasks assessed Behavior During Session: [facility] for tasks performed    Balance     End of Session PT - End of Session Equipment Utilized During Treatment: Gait belt Activity Tolerance: Patient tolerated treatment well Patient left: in bed;with call bell/phone within reach;with family/visitor present Nurse Communication: Mobility status   GP     Daysy Santini 07/29/2012, 1:00 PM

## 2012-07-29 NOTE — Care Management Note (Signed)
    Page 1 of 2   07/31/2012     12:13:46 PM   CARE MANAGEMENT NOTE 07/31/2012  Patient:  Erin Marshall, Erin Marshall   Account Number:  [id number]  Date Initiated:  07/29/2012  Documentation initiated by:  Colleen Can  Subjective/Objective Assessment:   dx osteoarthritis hip-left; total knee replacemnt     Action/Plan:   CM spoke with patient and family members. Plans are that patient will return to her home in [place] where spouse will e caregiver. States she will stay downstairs in her home where she has hosp bed. Will need RW &3n1. [facility]   Anticipated DC Date:  07/31/2012   Anticipated DC Plan:  HOME W HOME HEALTH SERVICES  In-house referral  NA      DC Planning Services  CM consult      [facility] Choice  HOME HEALTH  DURABLE MEDICAL EQUIPMENT   Choice offered to / List presented to:  C-1 Patient   DME arranged  Levan Hurst      DME agency  [facility]     HH arranged  HH-2 PT      [facility] agency  [facility]   Status of service:  Completed, signed off Medicare Important Message given?  NA - LOS <3 / Initial given by admissions (If response is "NO", the following Medicare IM given date fields will be blank) Date Medicare IM given:   Date Additional Medicare IM given:    Discharge Disposition:  HOME W HOME HEALTH SERVICES  Per UR Regulation:    If discussed at Long Length of Stay Meetings, dates discussed:    Comments:  07/31/2012 Raynelle Bring BSN CCM [phone] Pt for discharge today. RW has been delivered to room. [facility] will start [facility] services tomorrow 08/01/2012  07/29/2012 Raynelle Bring BSN CCM [phone] [facility] rep notified and can provide [facility] services and DME for patient. CM will follow

## 2012-07-29 NOTE — Anesthesia Postprocedure Evaluation (Signed)
Anesthesia Post Note  Patient: Erin Marshall  Procedure(s) Performed: Procedure(s) (LRB): TOTAL HIP ARTHROPLASTY (Left)  Anesthesia type: General  Patient location: PACU  Post pain: Pain level controlled  Post assessment: Post-op Vital signs reviewed  Last Vitals: BP 160/80  Pulse 80  Temp 36.3 C (Oral)  Resp 16  Ht 5\' 2"  (1.575 m)  Wt 156 lb (70.761 kg)  BMI 28.53 kg/m2  SpO2 100%  Post vital signs: Reviewed  Level of consciousness: sedated  Complications: No apparent anesthesia complications

## 2012-07-29 NOTE — Progress Notes (Signed)
Patient is requesting something for sleep. On call MD, Dr. Jillyn Hidden paged with orders to give Ambien 5mg  qHS PRN for sleep. Will continue to monitor patient.

## 2012-07-29 NOTE — Progress Notes (Signed)
   Subjective: 1 Day Post-Op Procedure(s) (LRB): TOTAL HIP ARTHROPLASTY (Left) Patient reports pain as mild.   Patient seen in rounds with Dr. Lequita Halt.  Family in room. Patient is well, and has had no acute complaints or problems We will start therapy today.  Plan is to go Home after hospital stay.  Objective: Vital signs in last 24 hours: Temp:  [97 F (36.1 C)-98.3 F (36.8 C)] 98.2 F (36.8 C) (08/13 0556) Pulse Rate:  [50-86] 86  (08/13 0556) Resp:  [8-20] 14  (08/13 0556) BP: (109-150)/(60-86) 144/72 mmHg (08/13 0556) SpO2:  [95 %-100 %] 100 % (08/13 0556) Weight:  [69.854 kg (154 lb)-70.761 kg (156 lb)] 70.761 kg (156 lb) (08/12 1716)  Intake/Output from previous day:  Intake/Output Summary (Last 24 hours) at 07/29/12 0830 Last data filed at 07/29/12 1610  Gross per 24 hour  Intake 3475.75 ml  Output   1910 ml  Net 1565.75 ml    Intake/Output this shift: Total I/O In: -  Out: 450 [Urine:450]  Labs:  [facility] 07/29/12 0430  HGB 10.0*    Basename 07/29/12 0430  WBC 13.0*  RBC 3.12*  HCT 28.2*  PLT 209    Basename 07/29/12 0430  NA 132*  K 4.5  CL 97  CO2 21  BUN 27*  CREATININE 0.80  GLUCOSE 117*  CALCIUM 9.0   No results found for this basename: LABPT:2,INR:2 in the last 72 hours  EXAM General - Patient is Alert, Appropriate and Oriented Extremity - Neurovascular intact Sensation intact distally Dorsiflexion/Plantar flexion intact Dressing - dressing C/D/I Motor Function - intact, moving foot and toes well on exam.  Hemovac pulled without difficulty.  Past Medical History  Diagnosis Date  . Coronary artery disease     Status post CABG, July 03, 1999  . Post PTCA     Status post PTCA and stenting of the proximal LAD-4 LIMA to LAD graft has become atretic after stenting of the proximal LAD , 2004  . Hyperlipidemia   . Hypertension   . Arthritis 07-21-12    osteoarthritis left hip    Assessment/Plan: 1 Day Post-Op Procedure(s)  (LRB): TOTAL HIP ARTHROPLASTY (Left) Principal Problem:  *OA (osteoarthritis) of hip Active Problems:  Postop Hyponatremia   [facility] diet Up with therapy Discharge home with home health  DVT Prophylaxis - Xarelto, 325 mg ASA on hold for now. Weight Bearing As Tolerated left Leg D/C Knee Immobilizer Hemovac Pulled Begin Therapy Hip Preacutions No vaccines.  Erin Marshall 07/29/2012, 8:30 AM

## 2012-07-29 NOTE — Progress Notes (Signed)
Urine output low. Notified Lenise Arena. Will give pt normal saline IV bolus and monitor output.

## 2012-07-29 NOTE — Progress Notes (Signed)
UR COMPLETED  

## 2012-07-29 NOTE — Evaluation (Signed)
Physical Therapy Evaluation Patient Details Name: Erin Marshall MRN: [id number] DOB: 04/26/1946 Today's Date: 07/29/2012 Time: 2956-2130 PT Time Calculation (min): 24 min  PT Assessment / Plan / Recommendation Clinical Impression  Pt with L THR presents with decreased L LE strength/ROM and limitations in functional mobility.      PT Assessment  Patient needs continued PT services    Follow Up Recommendations  Home health PT    Barriers to Discharge        Equipment Recommendations  Rolling walker with 5" wheels    Recommendations for Other Services OT consult   Frequency 7X/week    Precautions / Restrictions Precautions Precautions: Posterior Hip Precaution Comments: sign hung in room Restrictions Weight Bearing Restrictions: No Other Position/Activity Restrictions: WBAT   Pertinent Vitals/Pain 7/10 with activity; premedicated, ice pack provided, RN aware      Mobility  Bed Mobility Bed Mobility: Supine to Sit Supine to Sit: 1: +2 Total assist Supine to Sit: Patient Percentage: 60% Details for Bed Mobility Assistance: cues for sequence and use of R LE and UEs to self assist Transfers Transfers: Sit to Stand;Stand to Sit Sit to Stand: 1: +2 Total assist Sit to Stand: Patient Percentage: 60% Stand to Sit: 1: +2 Total assist Stand to Sit: Patient Percentage: 60% Details for Transfer Assistance: cues for use of UEs to self assist and for LE management and adherence to THP Ambulation/Gait Ambulation/Gait Assistance: 1: +2 Total assist Ambulation/Gait: Patient Percentage: 70% Ambulation Distance (Feet): 8 Feet Assistive device: Rolling walker Ambulation/Gait Assistance Details: cues for posture, sequence, position from RW and ER on L Gait Pattern: Step-to pattern    Exercises Total Joint Exercises Ankle Circles/Pumps: AROM;10 reps;Supine;Both Quad Sets: AROM;Both;10 reps;Supine Heel Slides: AAROM;10 reps;Supine;Left Hip ABduction/ADduction: AAROM;Left;10  reps;Supine   PT Diagnosis: Difficulty walking  PT Problem List: Decreased strength;Decreased activity tolerance;Decreased range of motion;Decreased mobility;Decreased knowledge of use of DME;Pain;Decreased knowledge of precautions PT Treatment Interventions: DME instruction;Gait training;Stair training;Functional mobility training;Therapeutic activities;Therapeutic exercise;Patient/family education   PT Goals Acute Rehab PT Goals PT Goal Formulation: With patient Time For Goal Achievement: 08/04/12 Potential to Achieve Goals: Good Pt will go Supine/Side to Sit: with supervision PT Goal: Supine/Side to Sit - Progress: Goal set today Pt will go Sit to Supine/Side: with supervision PT Goal: Sit to Supine/Side - Progress: Goal set today Pt will go Sit to Stand: with supervision PT Goal: Sit to Stand - Progress: Goal set today Pt will go Stand to Sit: with supervision PT Goal: Stand to Sit - Progress: Goal set today Pt will Ambulate: 51 - 150 feet;with supervision;with rolling walker PT Goal: Ambulate - Progress: Goal set today Pt will Go Up / Down Stairs: 3-5 stairs;with min assist;with least restrictive assistive device PT Goal: Up/Down Stairs - Progress: Goal set today  Visit Information  Last PT Received On: 07/29/12 Assistance Needed: +2    Subjective Data  Subjective: I've been crying every morning trying to get to the bathroom because it hurts Patient Stated Goal: Resume previous lifestyle with decreased pain   Prior Functioning  Home Living Lives With: Spouse Available Help at Discharge: Family Type of Home: House Home Access: Stairs to enter [employer] of Steps: 4 (3+1) Entrance Stairs-Rails: None Home Layout: One level Home Adaptive Equipment: Straight cane;Hospital bed Prior Function Level of Independence: Independent with assistive device(s) Able to Take Stairs?: Yes Communication Communication: No difficulties    Cognition  Overall Cognitive  Status: Appears within functional limits for tasks assessed/performed Arousal/Alertness: Awake/alert  Orientation Level: Appears intact for tasks assessed Behavior During Session: [facility] for tasks performed    Extremity/Trunk Assessment Right Upper Extremity Assessment RUE ROM/Strength/Tone: [facility] for tasks assessed Left Upper Extremity Assessment LUE ROM/Strength/Tone: WFL for tasks assessed Right Lower Extremity Assessment RLE ROM/Strength/Tone: [facility] for tasks assessed Left Lower Extremity Assessment LLE ROM/Strength/Tone: Deficits LLE ROM/Strength/Tone Deficits: hip strength 2/5 with AAROM to 90 flex and 15 abd   Balance    End of Session PT - End of Session Equipment Utilized During Treatment: Gait belt Activity Tolerance: Patient limited by pain;Other (comment) (pt anxiety level) Patient left: in chair;with call bell/phone within reach;with family/visitor present Nurse Communication: Mobility status  GP     Ramla Hase 07/29/2012, 11:13 AM

## 2012-07-30 ENCOUNTER — Encounter (HOSPITAL_COMMUNITY): Payer: Self-pay | Admitting: Orthopedic Surgery

## 2012-07-30 DIAGNOSIS — D62 Acute posthemorrhagic anemia: Secondary | ICD-10-CM | POA: Diagnosis not present

## 2012-07-30 LAB — BASIC METABOLIC PANEL
BUN: 22 mg/dL (ref 6–23)
CO2: 24 mEq/L (ref 19–32)
Chloride: 99 mEq/L (ref 96–112)
Creatinine, Ser: 0.7 mg/dL (ref 0.50–1.10)
Glucose, Bld: 109 mg/dL — ABNORMAL HIGH (ref 70–99)

## 2012-07-30 LAB — CBC
HCT: 22.8 % — ABNORMAL LOW (ref 36.0–46.0)
HCT: 20.2 % — ABNORMAL LOW (ref 36.0–46.0)
Hemoglobin: 7.9 g/dL — ABNORMAL LOW (ref 12.0–15.0)
Hemoglobin: 7.1 g/dL — ABNORMAL LOW (ref 12.0–15.0)
MCV: 93.1 fL (ref 78.0–100.0)
MCV: 92.2 fL (ref 78.0–100.0)
RBC: 2.45 MIL/uL — ABNORMAL LOW (ref 3.87–5.11)
RBC: 2.19 MIL/uL — ABNORMAL LOW (ref 3.87–5.11)
RDW: 13.6 % (ref 11.5–15.5)
RDW: 13.6 % (ref 11.5–15.5)
WBC: 12.3 10*3/uL — ABNORMAL HIGH (ref 4.0–10.5)
WBC: 9.2 10*3/uL (ref 4.0–10.5)

## 2012-07-30 MED ORDER — DIPHENHYDRAMINE HCL 25 MG PO CAPS
25.0000 mg | ORAL_CAPSULE | Freq: Once | ORAL | Status: AC
  Administered 2012-07-30: 25 mg via ORAL
  Filled 2012-07-30: qty 1

## 2012-07-30 MED ORDER — ACETAMINOPHEN 325 MG PO TABS
650.0000 mg | ORAL_TABLET | Freq: Once | ORAL | Status: AC
  Administered 2012-07-30: 650 mg via ORAL
  Filled 2012-07-30: qty 2

## 2012-07-30 NOTE — Progress Notes (Signed)
Physical Therapy Treatment Patient Details Name: SHOSHANAH DAPPER MRN: [id number] DOB: 09-24-1946 Today's Date: 07/30/2012 Time: 5784-6962 PT Time Calculation (min): 37 min  PT Assessment / Plan / Recommendation Comments on Treatment Session       Follow Up Recommendations  Home health PT    Barriers to Discharge        Equipment Recommendations  Rolling walker with 5" wheels    Recommendations for Other Services OT consult  Frequency 7X/week   Plan Discharge plan remains appropriate    Precautions / Restrictions Precautions Precautions: Posterior Hip Precaution Comments: Pt recalls 2/3 THP without cues Restrictions Weight Bearing Restrictions: No Other Position/Activity Restrictions: WBAT   Pertinent Vitals/Pain 6/10; premedicated,     Mobility  Transfers Transfers: Sit to Stand;Stand to Sit Sit to Stand: 4: Min assist;3: Mod assist Stand to Sit: 4: Min assist;3: Mod assist Details for Transfer Assistance: cues for use of UEs to self assist and for LE management and adherence to THP Ambulation/Gait Ambulation/Gait Assistance: 4: Min assist Ambulation Distance (Feet): 124 Feet Assistive device: Rolling walker Ambulation/Gait Assistance Details: cues for posture, sequence, position from RW and ER on L Gait Pattern: Step-to pattern    Exercises Total Joint Exercises Ankle Circles/Pumps: AROM;20 reps;Both;Supine Quad Sets: AROM;Both;10 reps;Supine Gluteal Sets: AROM;10 reps;Both;Supine Heel Slides: AAROM;20 reps;Left;Supine Hip ABduction/ADduction: AAROM;20 reps;Left;Supine   PT Diagnosis:    PT Problem List:   PT Treatment Interventions:     PT Goals Acute Rehab PT Goals Time For Goal Achievement: 08/04/12 Potential to Achieve Goals: Good Pt will go Supine/Side to Sit: with supervision PT Goal: Supine/Side to Sit - Progress: Progressing toward goal Pt will go Sit to Supine/Side: with supervision PT Goal: Sit to Supine/Side - Progress: Progressing toward  goal Pt will go Sit to Stand: with supervision PT Goal: Sit to Stand - Progress: Progressing toward goal Pt will go Stand to Sit: with supervision PT Goal: Stand to Sit - Progress: Progressing toward goal Pt will Ambulate: 51 - 150 feet;with supervision;with rolling walker PT Goal: Ambulate - Progress: Progressing toward goal Pt will Go Up / Down Stairs: 3-5 stairs;with min assist;with least restrictive assistive device  Visit Information  Last PT Received On: 07/30/12 Assistance Needed: +1    Subjective Data  Subjective: I walked with my family again last night and that might have been a little too much - I had a really rough night Patient Stated Goal: Resume previous lifestyle with decreased pain   Cognition  Overall Cognitive Status: Appears within functional limits for tasks assessed/performed Arousal/Alertness: Awake/alert Orientation Level: Appears intact for tasks assessed Behavior During Session: [facility] for tasks performed    Balance     End of Session PT - End of Session Activity Tolerance: Patient tolerated treatment well Patient left: in chair;with call bell/phone within reach;with family/visitor present Nurse Communication: Mobility status   GP     Jelina Paulsen 07/30/2012, 1:02 PM

## 2012-07-30 NOTE — Progress Notes (Signed)
   Subjective: 2 Days Post-Op Procedure(s) (LRB): TOTAL HIP ARTHROPLASTY (Left) Patient reports pain as mild.   Patient seen in rounds with Dr. Lequita Halt. Husband in room.  Slept better last night. Patient is well, and has had no acute complaints or problems Plan is to go Home after hospital stay.  Objective: Vital signs in last 24 hours: Temp:  [97.3 F (36.3 C)-99.1 F (37.3 C)] 99.1 F (37.3 C) (08/14 0510) Pulse Rate:  [76-104] 104  (08/14 0510) Resp:  [14-16] 16  (08/14 0510) BP: (96-160)/(57-80) 113/62 mmHg (08/14 0510) SpO2:  [93 %-100 %] 93 % (08/14 0510)  Intake/Output from previous day:  Intake/Output Summary (Last 24 hours) at 07/30/12 0911 Last data filed at 07/30/12 0745  Gross per 24 hour  Intake 1740.5 ml  Output   2550 ml  Net -809.5 ml    Intake/Output this shift: Total I/O In: -  Out: 200 [Urine:200]  Labs:  [facility] 07/30/12 0510 07/29/12 0430  HGB 7.9* 10.0*    Basename 07/30/12 0510 07/29/12 0430  WBC 12.3* 13.0*  RBC 2.45* 3.12*  HCT 22.8* 28.2*  PLT 197 209    Basename 07/30/12 0510 07/29/12 0430  NA 135 132*  K 3.8 4.5  CL 99 97  CO2 24 21  BUN 22 27*  CREATININE 0.70 0.80  GLUCOSE 109* 117*  CALCIUM 9.1 9.0   No results found for this basename: LABPT:2,INR:2 in the last 72 hours  EXAM General - Patient is Alert, Appropriate and Oriented Extremity - Neurovascular intact Sensation intact distally Dorsiflexion/Plantar flexion intact No cellulitis present Dressing/Incision - clean, dry, no drainage, healing Motor Function - intact, moving foot and toes well on exam.   Past Medical History  Diagnosis Date  . Coronary artery disease     Status post CABG, July 03, 1999  . Post PTCA     Status post PTCA and stenting of the proximal LAD-4 LIMA to LAD graft has become atretic after stenting of the proximal LAD , 2004  . Hyperlipidemia   . Hypertension   . Arthritis 07-21-12    osteoarthritis left hip    Assessment/Plan: 2  Days Post-Op Procedure(s) (LRB): TOTAL HIP ARTHROPLASTY (Left) Principal Problem:  *OA (osteoarthritis) of hip Active Problems:  Postop Hyponatremia  Postop Acute blood loss anemia  HGB down to 7.9 but patient is currently asymptomatic.  Will recheck HGB at 1300 today and make sure is stable.  She wants to go home so will check the HGB and see how she does with therapy.  If she develops symptoms or if the HGB goes down further, she may require blood.  If she does great and the HGB is stable or improved, then maybe home later this evening or tomorrow morning.  Advance diet Up with therapy Plan for discharge tomorrow Discharge home with home health  DVT Prophylaxis - Xarelto, 325 mg ASA on hold for now. Weight Bearing As Tolerated left Leg  Elizbeth Posa 07/30/2012, 9:11 AM

## 2012-07-30 NOTE — Progress Notes (Signed)
Physical Therapy Treatment Patient Details Name: Erin Marshall MRN: [id number] DOB: September 21, 1946 Today's Date: 07/30/2012 Time: 4098-1191 PT Time Calculation (min): 29 min  PT Assessment / Plan / Recommendation Comments on Treatment Session       Follow Up Recommendations  Home health PT    Barriers to Discharge        Equipment Recommendations  Rolling walker with 5" wheels    Recommendations for Other Services OT consult  Frequency 7X/week   Plan Discharge plan remains appropriate    Precautions / Restrictions Precautions Precautions: Posterior Hip Precaution Comments: Pt recalls 2/3 THP without cues Restrictions Weight Bearing Restrictions: No Other Position/Activity Restrictions: WBAT   Pertinent Vitals/Pain 6/10 with activity; pt premedicated, ice packs provided    Mobility  Bed Mobility Bed Mobility: Sit to Supine Supine to Sit: 4: Min assist Sit to Supine: 4: Min assist Details for Bed Mobility Assistance: cues for sequence and use of R LE and UEs to self assist Transfers Transfers: Sit to Stand;Stand to Sit Sit to Stand: 4: Min guard Stand to Sit: 4: Min guard Details for Transfer Assistance: cues for use of UEs to self assist and for LE management and adherence to THP Ambulation/Gait Ambulation/Gait Assistance: 4: Min guard Ambulation Distance (Feet): 30 Feet (x2) Assistive device: Rolling walker Ambulation/Gait Assistance Details: min cues for ER on L, posture and position from RW Gait Pattern: Step-to pattern Stairs: Yes Stairs Assistance: 4: Min assist Stairs Assistance Details (indicate cue type and reason): cues for sequence and foot/RW placement Stair Management Technique: No rails;Backwards;With walker;Step to pattern Number of Stairs: 4     Exercises     PT Diagnosis:    PT Problem List:   PT Treatment Interventions:     PT Goals Acute Rehab PT Goals PT Goal Formulation: With patient Time For Goal Achievement: 08/04/12 Potential to  Achieve Goals: Good Pt will go Supine/Side to Sit: with supervision PT Goal: Supine/Side to Sit - Progress: Progressing toward goal Pt will go Sit to Supine/Side: with supervision PT Goal: Sit to Supine/Side - Progress: Progressing toward goal Pt will go Sit to Stand: with supervision PT Goal: Sit to Stand - Progress: Progressing toward goal Pt will go Stand to Sit: with supervision PT Goal: Stand to Sit - Progress: Progressing toward goal Pt will Ambulate: 51 - 150 feet;with supervision;with rolling walker PT Goal: Ambulate - Progress: Progressing toward goal Pt will Go Up / Down Stairs: 3-5 stairs;with min assist;with least restrictive assistive device PT Goal: Up/Down Stairs - Progress: Progressing toward goal  Visit Information  Last PT Received On: 07/30/12 Assistance Needed: +1    Subjective Data  Subjective: Pt denies dizziness but reports feeling tired Patient Stated Goal: Resume previous lifestyle with decreased pain   Cognition  Overall Cognitive Status: Appears within functional limits for tasks assessed/performed Arousal/Alertness: Awake/alert Orientation Level: Appears intact for tasks assessed Behavior During Session: [facility] for tasks performed    Balance     End of Session PT - End of Session Equipment Utilized During Treatment: Gait belt Activity Tolerance: Patient limited by fatigue Patient left: in bed;with call bell/phone within reach;with family/visitor present Nurse Communication: Mobility status   GP     Erin Marshall 07/30/2012, 3:35 PM

## 2012-07-31 DIAGNOSIS — Z9289 Personal history of other medical treatment: Secondary | ICD-10-CM

## 2012-07-31 LAB — TYPE AND SCREEN: Unit division: 0

## 2012-07-31 LAB — BASIC METABOLIC PANEL
CO2: 24 mEq/L (ref 19–32)
Chloride: 100 mEq/L (ref 96–112)
Creatinine, Ser: 0.61 mg/dL (ref 0.50–1.10)
Glucose, Bld: 95 mg/dL (ref 70–99)

## 2012-07-31 LAB — CBC
Hemoglobin: 10 g/dL — ABNORMAL LOW (ref 12.0–15.0)
MCV: 89.9 fL (ref 78.0–100.0)
Platelets: 172 10*3/uL (ref 150–400)
RBC: 3.06 MIL/uL — ABNORMAL LOW (ref 3.87–5.11)
WBC: 9.6 10*3/uL (ref 4.0–10.5)

## 2012-07-31 MED ORDER — METHOCARBAMOL 500 MG PO TABS: 500.0000 mg | ORAL_TABLET | Freq: Four times a day (QID) | ORAL | Status: DC | PRN

## 2012-07-31 MED ORDER — HYDROMORPHONE HCL 2 MG PO TABS: 2.0000 mg | ORAL_TABLET | ORAL | Status: DC | PRN

## 2012-07-31 MED ORDER — RIVAROXABAN 10 MG PO TABS: 10.0000 mg | ORAL_TABLET | Freq: Every day | ORAL | Status: DC

## 2012-07-31 MED ORDER — HYDROMORPHONE HCL 2 MG PO TABS: 2.0000 mg | ORAL_TABLET | ORAL | Status: AC | PRN

## 2012-07-31 MED ORDER — METHOCARBAMOL 500 MG PO TABS: 500.0000 mg | ORAL_TABLET | Freq: Four times a day (QID) | ORAL | Status: AC | PRN

## 2012-07-31 MED ORDER — TRAMADOL HCL 50 MG PO TABS: 50.0000 mg | ORAL_TABLET | Freq: Four times a day (QID) | ORAL | Status: AC | PRN

## 2012-07-31 NOTE — Progress Notes (Signed)
   Subjective: 3 Days Post-Op Procedure(s) (LRB): TOTAL HIP ARTHROPLASTY (Left) Patient reports pain as mild.   Patient seen in rounds with Dr. Lequita Halt. Patient is well, and has had no acute complaints or problems Patient is ready to go home today.  Objective: Vital signs in last 24 hours: Temp:  [98.1 F (36.7 C)-99.5 F (37.5 C)] 99.5 F (37.5 C) (08/15 0448) Pulse Rate:  [93-114] 110  (08/15 0448) Resp:  [16] 16  (08/15 0448) BP: (92-162)/(55-82) 162/76 mmHg (08/15 0448) SpO2:  [93 %-99 %] 93 % (08/15 0448)  Intake/Output from previous day:  Intake/Output Summary (Last 24 hours) at 07/31/12 0931 Last data filed at 07/31/12 0855  Gross per 24 hour  Intake   1910 ml  Output    400 ml  Net   1510 ml    Intake/Output this shift: Total I/O In: 360 [P.O.:360] Out: -   Labs:  Basename 07/31/12 0415 07/30/12 1420 07/30/12 0510 07/29/12 0430  HGB 10.0* 7.1* 7.9* 10.0*    Basename 07/31/12 0415 07/30/12 1420  WBC 9.6 9.2  RBC 3.06* 2.19*  HCT 27.5* 20.2*  PLT 172 173    Basename 07/31/12 0415 07/30/12 0510  NA 134* 135  K 4.0 3.8  CL 100 99  CO2 24 24  BUN 17 22  CREATININE 0.61 0.70  GLUCOSE 95 109*  CALCIUM 8.7 9.1   No results found for this basename: LABPT:2,INR:2 in the last 72 hours  EXAM: General - Patient is Alert, Appropriate and Oriented Extremity - Neurovascular intact Sensation intact distally Dorsiflexion/Plantar flexion intact No cellulitis present Incision - clean, dry, no drainage, healing Motor Function - intact, moving foot and toes well on exam.   Assessment/Plan: 3 Days Post-Op Procedure(s) (LRB): TOTAL HIP ARTHROPLASTY (Left) Procedure(s) (LRB): TOTAL HIP ARTHROPLASTY (Left) Past Medical History  Diagnosis Date  . Coronary artery disease     Status post CABG, July 03, 1999  . Post PTCA     Status post PTCA and stenting of the proximal LAD-4 LIMA to LAD graft has become atretic after stenting of the proximal LAD , 2004  .  Hyperlipidemia   . Hypertension   . Arthritis 07-21-12    osteoarthritis left hip   Principal Problem:  *OA (osteoarthritis) of hip Active Problems:  Postop Hyponatremia  Postop Acute blood loss anemia  Postop Transfusion   Discharge home with home health Diet - Cardiac diet Follow up - in 2 weeks Activity - WBAT Disposition - Home Condition Upon Discharge - Good D/C Meds - See DC Summary DVT Prophylaxis - Xarelto, 325 mg ASA on hold for now.   Erin Marshall 07/31/2012, 9:31 AM

## 2012-07-31 NOTE — Evaluation (Signed)
Occupational Therapy Evaluation Patient Details Name: Erin Marshall MRN: [id number] DOB: 1946-10-01 Today's Date: 07/31/2012 Time: 4098-1191 OT Time Calculation (min): 29 min  OT Assessment / Plan / Recommendation Clinical Impression  This 66year old female was admitted for L THA.  She has thps and is wbat.  All education was completed for adls and thps.  Pt verbalizes all and will not need further OT at this time.      OT Assessment  Patient does not need any further OT services    Follow Up Recommendations  No OT follow up    Barriers to Discharge      Equipment Recommendations  Rolling walker with 5" wheels    Recommendations for Other Services    Frequency       Precautions / Restrictions Precautions Precautions: Posterior Hip Precaution Comments: recalls 3/3 thps Restrictions Other Position/Activity Restrictions: WBAT   Pertinent Vitals/Pain Premedicated. Moderate pain with movement    ADL  Eating/Feeding: Simulated;Independent Where Assessed - Eating/Feeding: Chair Grooming: Simulated;Set up Where Assessed - Grooming: Supported sitting Upper Body Bathing: Simulated;Set up Where Assessed - Upper Body Bathing: Supported sitting Lower Body Bathing: Simulated;Minimal assistance (with AE) Where Assessed - Lower Body Bathing: Supported sit to stand Upper Body Dressing: Simulated;Set up Where Assessed - Upper Body Dressing: Supported sitting Lower Body Dressing: Simulated;Moderate assistance (with AE) Where Assessed - Lower Body Dressing: Supported sit to Pharmacist, hospital: Simulated;Minimal assistance (bed to chair) Toilet Transfer Method: Stand pivot Toileting - Clothing Manipulation and Hygiene: Simulated;Min guard Where Assessed - Engineer, mining and Hygiene: Standing Tub/Shower Transfer: Other (comment) (reviewed sequence) Transfers/Ambulation Related to ADLs: min cues for sequencing RW and leg placement ADL Comments: reviewed adls and thps.  Son present.  Pt plans to get a reacher; will have husband to assist    OT Diagnosis:    OT Problem List:   OT Treatment Interventions:     OT Goals    Visit Information  Last OT Received On: 07/31/12 Assistance Needed: +1    Subjective Data  Subjective: Oh yeah, they keep telling me not to use the trapeze   Prior Functioning  Vision/Perception  Home Living Lives With: Spouse Bathroom Shower/Tub: Other (comment) (powder room downstairs; tub up) Bathroom Toilet: Standard Communication Communication: No difficulties Dominant Hand: Right      Cognition  Overall Cognitive Status: Appears within functional limits for tasks assessed/performed    Extremity/Trunk Assessment Right Upper Extremity Assessment RUE ROM/Strength/Tone: WFL for tasks assessed Left Upper Extremity Assessment LUE ROM/Strength/Tone: WFL for tasks assessed   Mobility Bed Mobility Supine to Sit: 4: Min assist;With rails;HOB flat Transfers Sit to Stand: 4: Min guard   Exercise    Balance    End of Session OT - End of Session Activity Tolerance: Patient tolerated treatment well Patient left: in chair;with call bell/phone within reach;with family/visitor present  GO     Vineta Carone 07/31/2012, 8:35 AM Marica Otter, OTR/L [phone] 07/31/2012

## 2012-07-31 NOTE — Progress Notes (Signed)
Physical Therapy Treatment Patient Details Name: Erin Marshall MRN: [id number] DOB: [birth year] Today's Date: 07/31/2012 Time: 6387-5643 PT Time Calculation (min): 38 min  PT Assessment / Plan / Recommendation Comments on Treatment Session  Reviewed car transfers with pt and spouse    Follow Up Recommendations  Home health PT    Barriers to Discharge        Equipment Recommendations  Rolling walker with 5" wheels    Recommendations for Other Services OT consult  Frequency 7X/week   Plan Discharge plan remains appropriate    Precautions / Restrictions Precautions Precautions: Posterior Hip Precaution Comments: pt recalls 2/3 THP without cues Restrictions Weight Bearing Restrictions: No Other Position/Activity Restrictions: WBAT   Pertinent Vitals/Pain 6/10 with activity    Mobility  Transfers Transfers: Sit to Stand;Stand to Sit Sit to Stand: 4: Min guard Stand to Sit: 4: Min guard Details for Transfer Assistance: cues for use of UEs to self assist and for LE management and adherence to THP Ambulation/Gait Ambulation/Gait Assistance: 4: Min guard;5: Supervision Ambulation Distance (Feet): 135 Feet Assistive device: Rolling walker Ambulation/Gait Assistance Details: min cues for posture, position from RW and ER on R Gait Pattern: Step-to pattern Stairs:  (pt states she feels comfortable with stairs)    Exercises Total Joint Exercises Ankle Circles/Pumps: AROM;20 reps;Both;Supine Quad Sets: AROM;Both;10 reps;Supine Gluteal Sets: AROM;Both;Supine;20 reps Heel Slides: AAROM;20 reps;Left;Supine Hip ABduction/ADduction: AAROM;20 reps;Left;Supine Long [employer]: Both;10 reps;Seated;AAROM;AROM   PT Diagnosis:    PT Problem List:   PT Treatment Interventions:     PT Goals Acute Rehab PT Goals PT Goal Formulation: With patient Time For Goal Achievement: 08/04/12 Potential to Achieve Goals: Good Pt will go Supine/Side to Sit: with supervision PT Goal: Supine/Side to  Sit - Progress: Progressing toward goal Pt will go Sit to Supine/Side: with supervision PT Goal: Sit to Supine/Side - Progress: Progressing toward goal Pt will go Sit to Stand: with supervision PT Goal: Sit to Stand - Progress: Progressing toward goal Pt will go Stand to Sit: with supervision PT Goal: Stand to Sit - Progress: Progressing toward goal Pt will Ambulate: 51 - 150 feet;with supervision;with rolling walker PT Goal: Ambulate - Progress: Progressing toward goal Pt will Go Up / Down Stairs: 3-5 stairs;with min assist;with least restrictive assistive device PT Goal: Up/Down Stairs - Progress: Progressing toward goal  Visit Information  Last PT Received On: 07/31/12 Assistance Needed: +1    Subjective Data  Subjective: No new complaints Patient Stated Goal: Resume previous lifestyle with decreased pain   Cognition  Overall Cognitive Status: Appears within functional limits for tasks assessed/performed Arousal/Alertness: Awake/alert Orientation Level: Appears intact for tasks assessed Behavior During Session: [facility] for tasks performed    Balance     End of Session PT - End of Session Equipment Utilized During Treatment: Gait belt Activity Tolerance: Patient tolerated treatment well Patient left: in chair;with call bell/phone within reach;with family/visitor present Nurse Communication: Mobility status   GP     Dalyn Kjos 07/31/2012, 12:28 PM

## 2012-07-31 NOTE — Discharge Summary (Signed)
Physician Discharge Summary   Patient ID: REBACCA VOTAW MRN: [id number] DOB/AGE: 04-23-1946 66 y.o.  Admit date: 07/28/2012 Discharge date: 07/31/2012  Primary Diagnosis: Osteoarthritis Left hip   Admission Diagnoses:  Past Medical History  Diagnosis Date  . Coronary artery disease     Status post CABG, July 03, 1999  . Post PTCA     Status post PTCA and stenting of the proximal LAD-4 LIMA to LAD graft has become atretic after stenting of the proximal LAD , 2004  . Hyperlipidemia   . Hypertension   . Arthritis 07-21-12    osteoarthritis left hip   Discharge Diagnoses:   Principal Problem:  *OA (osteoarthritis) of hip Active Problems:  Postop Hyponatremia  Postop Acute blood loss anemia  Postop Transfusion  Procedure: Procedure(s) (LRB): TOTAL HIP ARTHROPLASTY (Left)   Consults: None  HPI: Erin Marshall is a 66 y.o. female with end stage arthritis of her left hip with progressively worsening pain and dysfunction. Pain occurs with activity and rest including pain at night. She has tried analgesics, protected weight bearing and rest without benefit. Pain is too severe to attempt physical therapy. Radiographs demonstrate bone on bone arthritis with subchondral cyst formation. She presents now for left THA.  Laboratory Data: Hospital Outpatient Visit on 07/21/2012  Component Date Value Range Status  . MRSA, PCR 07/21/2012 NEGATIVE  NEGATIVE Final  . Staphylococcus aureus 07/21/2012 NEGATIVE  NEGATIVE Final   Comment:                                 The Xpert SA Assay (FDA                          approved for NASAL specimens                          only), is one component of                          a comprehensive surveillance                          program.  It is not intended                          to diagnose infection nor to                          guide or monitor treatment.  Marland Kitchen aPTT 07/21/2012 28  24 - 37 seconds Final  . WBC 07/21/2012 7.3  4.0 - 10.5 K/uL  Final  . RBC 07/21/2012 3.98  3.87 - 5.11 MIL/uL Final  . Hemoglobin 07/21/2012 13.1  12.0 - 15.0 g/dL Final  . HCT 08/65/7846 36.9  36.0 - 46.0 % Final  . MCV 07/21/2012 92.7  78.0 - 100.0 fL Final  . MCH 07/21/2012 32.9  26.0 - 34.0 pg Final  . MCHC 07/21/2012 35.5  30.0 - 36.0 g/dL Final  . RDW 96/29/5284 13.4  11.5 - 15.5 % Final  . Platelets 07/21/2012 257  150 - 400 K/uL Final  . Sodium 07/21/2012 140  135 - 145 mEq/L Final  . Potassium 07/21/2012 4.0  3.5 - 5.1 mEq/L Final  . Chloride 07/21/2012 103  96 - 112 mEq/L Final  . CO2 07/21/2012 26  19 - 32 mEq/L Final  . Glucose, Bld 07/21/2012 92  70 - 99 mg/dL Final  . BUN 65/78/4696 29* 6 - 23 mg/dL Final  . Creatinine, Ser 07/21/2012 0.81  0.50 - 1.10 mg/dL Final  . Calcium 29/52/8413 10.3  8.4 - 10.5 mg/dL Final  . Total Protein 07/21/2012 7.9  6.0 - 8.3 g/dL Final  . Albumin 24/40/1027 4.2  3.5 - 5.2 g/dL Final  . AST 25/36/6440 36  0 - 37 U/L Final  . ALT 07/21/2012 42* 0 - 35 U/L Final  . Alkaline Phosphatase 07/21/2012 37* 39 - 117 U/L Final  . Total Bilirubin 07/21/2012 0.4  0.3 - 1.2 mg/dL Final  . GFR calc non Af Amer 07/21/2012 73* >90 mL/min Final  . GFR calc Af Amer 07/21/2012 85* >90 mL/min Final   Comment:                                 The eGFR has been calculated                          using the CKD EPI equation.                          This calculation has not been                          validated in all clinical                          situations.                          eGFR's persistently                          <90 mL/min signify                          possible Chronic Kidney Disease.  Marland Kitchen Prothrombin Time 07/21/2012 12.9  11.6 - 15.2 seconds Final  . INR 07/21/2012 0.95  0.00 - 1.49 Final  . Color, Urine 07/21/2012 YELLOW  YELLOW Final  . APPearance 07/21/2012 CLOUDY* CLEAR Final  . Specific Gravity, Urine 07/21/2012 1.025  1.005 - 1.030 Final  . pH 07/21/2012 6.5  5.0 - 8.0 Final  . Glucose,  UA 07/21/2012 NEGATIVE  NEGATIVE mg/dL Final  . Hgb urine dipstick 07/21/2012 NEGATIVE  NEGATIVE Final  . Bilirubin Urine 07/21/2012 NEGATIVE  NEGATIVE Final  . Ketones, ur 07/21/2012 NEGATIVE  NEGATIVE mg/dL Final  . Protein, ur 34/74/2595 NEGATIVE  NEGATIVE mg/dL Final  . Urobilinogen, UA 07/21/2012 0.2  0.0 - 1.0 mg/dL Final  . Nitrite 63/87/5643 NEGATIVE  NEGATIVE Final  . Leukocytes, UA 07/21/2012 NEGATIVE  NEGATIVE Final   MICROSCOPIC NOT DONE ON URINES WITH NEGATIVE PROTEIN, BLOOD, LEUKOCYTES, NITRITE, OR GLUCOSE <1000 mg/dL.    Basename 07/31/12 0415 07/30/12 1420 07/30/12 0510 07/29/12 0430  HGB 10.0* 7.1* 7.9* 10.0*    Basename 07/31/12 0415 07/30/12 1420  WBC 9.6 9.2  RBC 3.06* 2.19*  HCT 27.5* 20.2*  PLT 172 173    Basename 07/31/12 0415 07/30/12 0510  NA 134* 135  K 4.0 3.8  CL 100 99  CO2 24 24  BUN 17 22  CREATININE 0.61 0.70  GLUCOSE 95 109*  CALCIUM 8.7 9.1   No results found for this basename: LABPT:2,INR:2 in the last 72 hours  X-Rays:Dg Chest 2 View  07/21/2012  *RADIOLOGY REPORT*  Clinical Data: Preop for left hip replacement  CHEST - 2 VIEW  Comparison: 10/02/2006  Findings: Cardiomediastinal silhouette is stable.  Status post CABG.  No acute infiltrate or pleural effusion.  No pulmonary edema.  Stable mild degenerative changes thoracic spine.  IMPRESSION: No active disease.  No significant change.  Original Report Authenticated By: Natasha Mead, M.D.   Dg Hip Complete Left  07/21/2012  *RADIOLOGY REPORT*  Clinical Data: Preop for left hip replacement  LEFT HIP - COMPLETE 2+ VIEW  Comparison: None.  Findings: Three views of the left hip submitted.  Degenerative changes are noted left hip joint with narrowing of superior joint space.  No acute fracture or subluxation.  IMPRESSION: No acute fracture or subluxation.  Degenerative changes left hip joint with narrowing of superior joint space.  Original Report Authenticated By: Natasha Mead, M.D.   Dg Pelvis  Portable  07/28/2012  *RADIOLOGY REPORT*  Clinical Data: Hip replacement  PORTABLE PELVIS  Comparison: 07/21/2012  Findings: Left hip replacement in satisfactory position and alignment.  No fracture or radiographic complication.  IMPRESSION: Satisfactory left hip replacement.  Original Report Authenticated By: Camelia Phenes, M.D.    EKG: Orders placed during the hospital encounter of 07/21/12  . EKG 12-LEAD  . EKG 12-LEAD     Hospital Course: Patient was admitted to [facility] and taken to the OR and underwent the above state procedure without complications.  Patient tolerated the procedure well and was later transferred to the recovery room and then to the orthopaedic floor for postoperative care.  They were given PO and IV analgesics for pain control following their surgery.  They were given 24 hours of postoperative antibiotics and started on DVT prophylaxis in the form of Xarelto.   PT and OT were ordered for total hip protocol.  The patient was allowed to be WBAT with therapy. Discharge planning was consulted to help with postop disposition and equipment needs.  Patient had a good night on the evening of surgery and started to get up OOB with therapy on day one.  Hemovac drain was pulled without difficulty.  The knee immobilizer was removed and discontinued.  Continued to work with therapy into day two.  Dressing was changed on day two and the incision was healing well and she slept better the second night.  By day three, the patient had progressed with therapy and meeting their goals.  Incision was healing well.  Patient was seen in rounds and was ready to go home.  Discharge Medications: Prior to Admission medications   Medication Sig Start Date End Date Taking? Authorizing Provider  atenolol (TENORMIN) 25 MG tablet Take 25 mg by mouth every morning. 06/23/12  Yes Vesta Mixer, MD  isosorbide mononitrate (IMDUR) 30 MG 24 hr tablet Take 30 mg by mouth every morning. 08/23/11  Yes  Vesta Mixer, MD  atorvastatin (LIPITOR) 80 MG tablet Take 40 mg by mouth every morning. 10/31/11   Vesta Mixer, MD  HYDROmorphone (DILAUDID) 2 MG tablet Take 1-2 tablets (2-4 mg total) by mouth every 4 (four) hours as needed. 07/31/12 08/10/12  Candus Braud, PA  methocarbamol (ROBAXIN) 500 MG tablet Take 1 tablet (500  mg total) by mouth every 6 (six) hours as needed. 07/31/12 08/10/12  Naren Benally, PA  nitroGLYCERIN (NITROSTAT) 0.4 MG SL tablet Place 1 tablet (0.4 mg total) under the tongue every 5 (five) minutes as needed. For chest pain 07/21/12   Vesta Mixer, MD  olmesartan-hydrochlorothiazide [facility] HCT) 20-12.5 MG per tablet Take 1 tablet by mouth every morning. 05/07/12   Vesta Mixer, MD  rivaroxaban (XARELTO) 10 MG TABS tablet Take 1 tablet (10 mg total) by mouth daily with breakfast. Take Xarelto for two and a half more weeks, then discontinue Xarelto. Once the patient has completed the Xarelto, they may resume the 325 mg Aspirin. 07/31/12   Shelsea Hangartner Julien Girt, PA    Diet: Cardiac diet Activity:WBAT No bending hip over 90 degrees- A "L" Angle Do not cross legs Do not let foot roll inward When turning these patients a pillow should be placed between the patient's legs to prevent crossing. Patients should have the affected knee fully extended when trying to sit or stand from all surfaces to prevent excessive hip flexion. When ambulating and turning toward the affected side the affected leg should have the toes turned out prior to moving the walker and the rest of patient's body as to prevent internal rotation/ turning in of the leg. Abduction pillows are the most effective way to prevent a patient from not crossing legs or turning toes in at rest. If an abduction pillow is not ordered placing a regular pillow length wise between the patient's legs is also an effective reminder. It is imperative that these precautions be maintained so that the surgical hip does not  dislocate. Follow-up:in 2 weeks Disposition - Home Discharged Condition: good   Discharge Orders    Future Appointments: Provider: Department: Dept Phone: Center:   09/12/2012 9:05 AM [facility] [phone] [id number]   09/16/2012 9:30 AM Vesta Mixer, MD [facility] [phone] None     Future Orders Please Complete By Expires   Diet - low sodium heart healthy      Call MD / Call 911      Comments:   If you experience chest pain or shortness of breath, CALL 911 and be transported to the hospital emergency room.  If you develope a fever above 101 F, pus (white drainage) or increased drainage or redness at the wound, or calf pain, call your surgeon's office.   Discharge instructions      Comments:   Pick up stool softner and laxative for home. Do not submerge incision under water. May shower. Continue to use ice for pain and swelling from surgery. Hip precautions.  Total Hip Protocol.  Take Xarelto for two and a half more weeks, then discontinue Xarelto. Once the patient has completed the Xarelto, they may resume the 325 mg Aspirin.   Constipation Prevention      Comments:   Drink plenty of fluids.  Prune juice may be helpful.  You may use a stool softener, such as Colace (over the counter) 100 mg twice a day.  Use MiraLax (over the counter) for constipation as needed.   Increase activity slowly as tolerated      Patient may shower      Comments:   You may shower without a dressing once there is no drainage.  Do not wash over the wound.  If drainage remains, do not shower until drainage stops.   Driving restrictions      Comments:   No driving until released  by the physician.   Lifting restrictions      Comments:   No lifting until released by the physician.   Follow the hip precautions as taught in Physical Therapy      Change dressing      Comments:   You may change your dressing dressing daily with sterile 4 x 4 inch gauze dressing  and paper tape.  Do not submerge the incision under water.   TED hose      Comments:   Use stockings (TED hose) for 3 weeks on both leg(s).  You may remove them at night for sleeping.   Do not sit on low chairs, stoools or toilet seats, as it may be difficult to get up from low surfaces        Medication List  As of 07/31/2012  9:37 AM   STOP taking these medications         aspirin 325 MG EC tablet      CALCIUM 600 + D PO      fish oil-omega-3 fatty acids 1000 MG capsule      HYDROcodone-acetaminophen 5-325 MG per tablet      multivitamin tablet         TAKE these medications         atenolol 25 MG tablet   Commonly known as: TENORMIN   Take 25 mg by mouth every morning.      atorvastatin 80 MG tablet   Commonly known as: LIPITOR   Take 40 mg by mouth every morning.      HYDROmorphone 2 MG tablet   Commonly known as: DILAUDID   Take 1-2 tablets (2-4 mg total) by mouth every 4 (four) hours as needed.      isosorbide mononitrate 30 MG 24 hr tablet   Commonly known as: IMDUR   Take 30 mg by mouth every morning.      methocarbamol 500 MG tablet   Commonly known as: ROBAXIN   Take 1 tablet (500 mg total) by mouth every 6 (six) hours as needed.      nitroGLYCERIN 0.4 MG SL tablet   Commonly known as: NITROSTAT   Place 1 tablet (0.4 mg total) under the tongue every 5 (five) minutes as needed. For chest pain      olmesartan-hydrochlorothiazide 20-12.5 MG per tablet   Commonly known as: BENICAR HCT   Take 1 tablet by mouth every morning.      rivaroxaban 10 MG Tabs tablet   Commonly known as: XARELTO   Take 1 tablet (10 mg total) by mouth daily with breakfast. Take Xarelto for two and a half more weeks, then discontinue Xarelto.  Once the patient has completed the Xarelto, they may resume the 325 mg Aspirin.           Follow-up Information    Follow up with Loanne Drilling, MD. Schedule an appointment as soon as possible for a visit in 2 weeks.   Contact  information:   [facility] [address] [place] [address] [phone]          Signed: Patrica Marshall 07/31/2012, 9:37 AM

## 2012-08-01 DIAGNOSIS — I2510 Atherosclerotic heart disease of native coronary artery without angina pectoris: Secondary | ICD-10-CM | POA: Diagnosis not present

## 2012-08-01 DIAGNOSIS — I10 Essential (primary) hypertension: Secondary | ICD-10-CM | POA: Diagnosis not present

## 2012-08-01 DIAGNOSIS — Z96649 Presence of unspecified artificial hip joint: Secondary | ICD-10-CM | POA: Diagnosis not present

## 2012-08-01 DIAGNOSIS — Z471 Aftercare following joint replacement surgery: Secondary | ICD-10-CM | POA: Diagnosis not present

## 2012-08-01 DIAGNOSIS — IMO0001 Reserved for inherently not codable concepts without codable children: Secondary | ICD-10-CM | POA: Diagnosis not present

## 2012-08-04 DIAGNOSIS — IMO0001 Reserved for inherently not codable concepts without codable children: Secondary | ICD-10-CM | POA: Diagnosis not present

## 2012-08-04 DIAGNOSIS — I2510 Atherosclerotic heart disease of native coronary artery without angina pectoris: Secondary | ICD-10-CM | POA: Diagnosis not present

## 2012-08-04 DIAGNOSIS — Z96649 Presence of unspecified artificial hip joint: Secondary | ICD-10-CM | POA: Diagnosis not present

## 2012-08-04 DIAGNOSIS — Z471 Aftercare following joint replacement surgery: Secondary | ICD-10-CM | POA: Diagnosis not present

## 2012-08-04 DIAGNOSIS — I10 Essential (primary) hypertension: Secondary | ICD-10-CM | POA: Diagnosis not present

## 2012-08-05 DIAGNOSIS — Z471 Aftercare following joint replacement surgery: Secondary | ICD-10-CM | POA: Diagnosis not present

## 2012-08-05 DIAGNOSIS — Z96649 Presence of unspecified artificial hip joint: Secondary | ICD-10-CM | POA: Diagnosis not present

## 2012-08-05 DIAGNOSIS — IMO0001 Reserved for inherently not codable concepts without codable children: Secondary | ICD-10-CM | POA: Diagnosis not present

## 2012-08-05 DIAGNOSIS — I2510 Atherosclerotic heart disease of native coronary artery without angina pectoris: Secondary | ICD-10-CM | POA: Diagnosis not present

## 2012-08-05 DIAGNOSIS — I10 Essential (primary) hypertension: Secondary | ICD-10-CM | POA: Diagnosis not present

## 2012-08-07 DIAGNOSIS — IMO0001 Reserved for inherently not codable concepts without codable children: Secondary | ICD-10-CM | POA: Diagnosis not present

## 2012-08-07 DIAGNOSIS — Z471 Aftercare following joint replacement surgery: Secondary | ICD-10-CM | POA: Diagnosis not present

## 2012-08-07 DIAGNOSIS — I2510 Atherosclerotic heart disease of native coronary artery without angina pectoris: Secondary | ICD-10-CM | POA: Diagnosis not present

## 2012-08-07 DIAGNOSIS — I10 Essential (primary) hypertension: Secondary | ICD-10-CM | POA: Diagnosis not present

## 2012-08-07 DIAGNOSIS — Z96649 Presence of unspecified artificial hip joint: Secondary | ICD-10-CM | POA: Diagnosis not present

## 2012-08-08 DIAGNOSIS — Z96649 Presence of unspecified artificial hip joint: Secondary | ICD-10-CM | POA: Diagnosis not present

## 2012-08-08 DIAGNOSIS — I10 Essential (primary) hypertension: Secondary | ICD-10-CM | POA: Diagnosis not present

## 2012-08-08 DIAGNOSIS — Z471 Aftercare following joint replacement surgery: Secondary | ICD-10-CM | POA: Diagnosis not present

## 2012-08-08 DIAGNOSIS — IMO0001 Reserved for inherently not codable concepts without codable children: Secondary | ICD-10-CM | POA: Diagnosis not present

## 2012-08-08 DIAGNOSIS — I2510 Atherosclerotic heart disease of native coronary artery without angina pectoris: Secondary | ICD-10-CM | POA: Diagnosis not present

## 2012-08-11 DIAGNOSIS — Z96649 Presence of unspecified artificial hip joint: Secondary | ICD-10-CM | POA: Diagnosis not present

## 2012-08-11 DIAGNOSIS — Z471 Aftercare following joint replacement surgery: Secondary | ICD-10-CM | POA: Diagnosis not present

## 2012-08-11 DIAGNOSIS — I10 Essential (primary) hypertension: Secondary | ICD-10-CM | POA: Diagnosis not present

## 2012-08-11 DIAGNOSIS — I2510 Atherosclerotic heart disease of native coronary artery without angina pectoris: Secondary | ICD-10-CM | POA: Diagnosis not present

## 2012-08-11 DIAGNOSIS — IMO0001 Reserved for inherently not codable concepts without codable children: Secondary | ICD-10-CM | POA: Diagnosis not present

## 2012-08-13 DIAGNOSIS — IMO0001 Reserved for inherently not codable concepts without codable children: Secondary | ICD-10-CM | POA: Diagnosis not present

## 2012-08-13 DIAGNOSIS — Z96649 Presence of unspecified artificial hip joint: Secondary | ICD-10-CM | POA: Diagnosis not present

## 2012-08-13 DIAGNOSIS — I10 Essential (primary) hypertension: Secondary | ICD-10-CM | POA: Diagnosis not present

## 2012-08-13 DIAGNOSIS — I2510 Atherosclerotic heart disease of native coronary artery without angina pectoris: Secondary | ICD-10-CM | POA: Diagnosis not present

## 2012-08-13 DIAGNOSIS — Z471 Aftercare following joint replacement surgery: Secondary | ICD-10-CM | POA: Diagnosis not present

## 2012-08-15 DIAGNOSIS — I10 Essential (primary) hypertension: Secondary | ICD-10-CM | POA: Diagnosis not present

## 2012-08-15 DIAGNOSIS — Z471 Aftercare following joint replacement surgery: Secondary | ICD-10-CM | POA: Diagnosis not present

## 2012-08-15 DIAGNOSIS — I2510 Atherosclerotic heart disease of native coronary artery without angina pectoris: Secondary | ICD-10-CM | POA: Diagnosis not present

## 2012-08-15 DIAGNOSIS — IMO0001 Reserved for inherently not codable concepts without codable children: Secondary | ICD-10-CM | POA: Diagnosis not present

## 2012-08-15 DIAGNOSIS — Z96649 Presence of unspecified artificial hip joint: Secondary | ICD-10-CM | POA: Diagnosis not present

## 2012-08-19 DIAGNOSIS — I2510 Atherosclerotic heart disease of native coronary artery without angina pectoris: Secondary | ICD-10-CM | POA: Diagnosis not present

## 2012-08-19 DIAGNOSIS — I10 Essential (primary) hypertension: Secondary | ICD-10-CM | POA: Diagnosis not present

## 2012-08-19 DIAGNOSIS — IMO0001 Reserved for inherently not codable concepts without codable children: Secondary | ICD-10-CM | POA: Diagnosis not present

## 2012-08-19 DIAGNOSIS — Z96649 Presence of unspecified artificial hip joint: Secondary | ICD-10-CM | POA: Diagnosis not present

## 2012-08-19 DIAGNOSIS — Z471 Aftercare following joint replacement surgery: Secondary | ICD-10-CM | POA: Diagnosis not present

## 2012-08-21 DIAGNOSIS — I10 Essential (primary) hypertension: Secondary | ICD-10-CM | POA: Diagnosis not present

## 2012-08-21 DIAGNOSIS — IMO0001 Reserved for inherently not codable concepts without codable children: Secondary | ICD-10-CM | POA: Diagnosis not present

## 2012-08-21 DIAGNOSIS — I2510 Atherosclerotic heart disease of native coronary artery without angina pectoris: Secondary | ICD-10-CM | POA: Diagnosis not present

## 2012-08-21 DIAGNOSIS — Z96649 Presence of unspecified artificial hip joint: Secondary | ICD-10-CM | POA: Diagnosis not present

## 2012-08-21 DIAGNOSIS — Z471 Aftercare following joint replacement surgery: Secondary | ICD-10-CM | POA: Diagnosis not present

## 2012-08-27 DIAGNOSIS — IMO0001 Reserved for inherently not codable concepts without codable children: Secondary | ICD-10-CM | POA: Diagnosis not present

## 2012-08-27 DIAGNOSIS — Z96649 Presence of unspecified artificial hip joint: Secondary | ICD-10-CM | POA: Diagnosis not present

## 2012-08-27 DIAGNOSIS — I10 Essential (primary) hypertension: Secondary | ICD-10-CM | POA: Diagnosis not present

## 2012-08-27 DIAGNOSIS — I2510 Atherosclerotic heart disease of native coronary artery without angina pectoris: Secondary | ICD-10-CM | POA: Diagnosis not present

## 2012-08-27 DIAGNOSIS — Z471 Aftercare following joint replacement surgery: Secondary | ICD-10-CM | POA: Diagnosis not present

## 2012-09-04 DIAGNOSIS — M169 Osteoarthritis of hip, unspecified: Secondary | ICD-10-CM | POA: Diagnosis not present

## 2012-09-05 DIAGNOSIS — Z96649 Presence of unspecified artificial hip joint: Secondary | ICD-10-CM | POA: Diagnosis not present

## 2012-09-05 DIAGNOSIS — IMO0001 Reserved for inherently not codable concepts without codable children: Secondary | ICD-10-CM | POA: Diagnosis not present

## 2012-09-05 DIAGNOSIS — I2510 Atherosclerotic heart disease of native coronary artery without angina pectoris: Secondary | ICD-10-CM | POA: Diagnosis not present

## 2012-09-05 DIAGNOSIS — Z471 Aftercare following joint replacement surgery: Secondary | ICD-10-CM | POA: Diagnosis not present

## 2012-09-05 DIAGNOSIS — I10 Essential (primary) hypertension: Secondary | ICD-10-CM | POA: Diagnosis not present

## 2012-09-08 ENCOUNTER — Telehealth: Payer: Self-pay | Admitting: Cardiovascular Disease

## 2012-09-08 NOTE — Telephone Encounter (Signed)
Told pt it didn't interfere with test/ fasting labs.

## 2012-09-08 NOTE — Telephone Encounter (Signed)
New problem:  Upcoming flu shot this week. Will that have any affect to the test results .

## 2012-09-10 ENCOUNTER — Other Ambulatory Visit: Payer: Self-pay | Admitting: *Deleted

## 2012-09-10 DIAGNOSIS — E785 Hyperlipidemia, unspecified: Secondary | ICD-10-CM

## 2012-09-10 DIAGNOSIS — I10 Essential (primary) hypertension: Secondary | ICD-10-CM

## 2012-09-10 DIAGNOSIS — D62 Acute posthemorrhagic anemia: Secondary | ICD-10-CM

## 2012-09-10 DIAGNOSIS — I2510 Atherosclerotic heart disease of native coronary artery without angina pectoris: Secondary | ICD-10-CM

## 2012-09-11 DIAGNOSIS — Z23 Encounter for immunization: Secondary | ICD-10-CM | POA: Diagnosis not present

## 2012-09-12 ENCOUNTER — Other Ambulatory Visit (INDEPENDENT_AMBULATORY_CARE_PROVIDER_SITE_OTHER): Payer: Medicare Other

## 2012-09-12 DIAGNOSIS — I2510 Atherosclerotic heart disease of native coronary artery without angina pectoris: Secondary | ICD-10-CM | POA: Diagnosis not present

## 2012-09-12 DIAGNOSIS — E785 Hyperlipidemia, unspecified: Secondary | ICD-10-CM

## 2012-09-12 DIAGNOSIS — D62 Acute posthemorrhagic anemia: Secondary | ICD-10-CM

## 2012-09-12 DIAGNOSIS — I10 Essential (primary) hypertension: Secondary | ICD-10-CM

## 2012-09-12 LAB — BASIC METABOLIC PANEL
BUN: 16 mg/dL (ref 6–23)
CO2: 27 mEq/L (ref 19–32)
Calcium: 9.8 mg/dL (ref 8.4–10.5)
Glucose, Bld: 95 mg/dL (ref 70–99)
Potassium: 4 mEq/L (ref 3.5–5.1)
Sodium: 138 mEq/L (ref 135–145)

## 2012-09-12 LAB — HEPATIC FUNCTION PANEL
ALT: 22 U/L (ref 0–35)
AST: 28 U/L (ref 0–37)
Alkaline Phosphatase: 42 U/L (ref 39–117)
Bilirubin, Direct: 0 mg/dL (ref 0.0–0.3)
Total Bilirubin: 0.5 mg/dL (ref 0.3–1.2)

## 2012-09-12 LAB — CBC WITH DIFFERENTIAL/PLATELET
Eosinophils Relative: 4.8 % (ref 0.0–5.0)
Lymphocytes Relative: 40.4 % (ref 12.0–46.0)
Monocytes Relative: 9.5 % (ref 3.0–12.0)
Neutrophils Relative %: 44.8 % (ref 43.0–77.0)
Platelets: 259 10*3/uL (ref 150.0–400.0)
WBC: 6.4 10*3/uL (ref 4.5–10.5)

## 2012-09-12 LAB — LIPID PANEL
HDL: 43.1 mg/dL (ref >39.00)
Total CHOL/HDL Ratio: 3
VLDL: 25.4 mg/dL (ref 0.0–40.0)

## 2012-09-16 ENCOUNTER — Ambulatory Visit: Payer: Medicare Other | Admitting: Cardiovascular Disease

## 2012-09-17 ENCOUNTER — Other Ambulatory Visit: Payer: Self-pay | Admitting: *Deleted

## 2012-09-17 MED ORDER — ISOSORBIDE MONONITRATE ER 30 MG PO TB24: 30.0000 mg | ORAL_TABLET | Freq: Every morning | ORAL | Status: DC

## 2012-09-17 NOTE — Telephone Encounter (Signed)
Pt needs appointment then refill can be made Fax Received. Refill Completed. Tanor Glaspy Chowoe (R.M.A)   

## 2012-09-19 ENCOUNTER — Encounter: Payer: Self-pay | Admitting: Cardiovascular Disease

## 2012-09-19 ENCOUNTER — Encounter: Payer: Self-pay | Admitting: Cardiology

## 2012-09-29 ENCOUNTER — Telehealth: Payer: Self-pay | Admitting: Cardiovascular Disease

## 2012-09-29 MED ORDER — ATENOLOL 25 MG PO TABS: 25.0000 mg | ORAL_TABLET | Freq: Every morning | ORAL | Status: DC

## 2012-09-29 NOTE — Telephone Encounter (Signed)
atenaolol 25mg , [facility] [address], pt has one pill left, said [facility], needs refill asap today

## 2012-09-29 NOTE — Telephone Encounter (Signed)
Refill completed.

## 2012-10-23 ENCOUNTER — Ambulatory Visit (INDEPENDENT_AMBULATORY_CARE_PROVIDER_SITE_OTHER): Payer: Medicare Other | Admitting: Cardiovascular Disease

## 2012-10-23 ENCOUNTER — Encounter: Payer: Self-pay | Admitting: Cardiovascular Disease

## 2012-10-23 VITALS — BP 140/90 | HR 61 | Ht 62.5 in | Wt 147.1 lb

## 2012-10-23 DIAGNOSIS — I2510 Atherosclerotic heart disease of native coronary artery without angina pectoris: Secondary | ICD-10-CM

## 2012-10-23 MED ORDER — NITROGLYCERIN 0.4 MG SL SUBL: 0.4000 mg | SUBLINGUAL_TABLET | SUBLINGUAL | Status: DC | PRN

## 2012-10-23 NOTE — Progress Notes (Addendum)
Erin Marshall Date of Birth  August 13, 1946 [facility] [address]     [address] [place], [place]  [address] [phone]  Fax  [phone]   Problem List: 1. CAD, CABG 2. HTN 3. Left hip relacement   History of Present Illness:  66 year old female with a history of coronary artery disease feet she status post coronary artery bypass grafting in 2000 with stenting of the LAD in 2004. 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  She's done well since I last saw her. She's not had any episodes of chest pain or shortness of breath.  She has had left hip replacement in August.  She was on Xarelto for a month or so afterwards.  Current Outpatient Prescriptions on File Prior to Visit  Medication Sig Dispense Refill  . atenolol (TENORMIN) 25 MG tablet Take 1 tablet (25 mg total) by mouth every morning.  30 tablet  11  . atorvastatin (LIPITOR) 80 MG tablet Take 40 mg by mouth every morning.      . isosorbide mononitrate (IMDUR) 30 MG 24 hr tablet Take 1 tablet (30 mg total) by mouth every morning.  60 tablet  0  . nitroGLYCERIN (NITROSTAT) 0.4 MG SL tablet Place 1 tablet (0.4 mg total) under the tongue every 5 (five) minutes as needed. For chest pain  25 tablet  4  . olmesartan-hydrochlorothiazide (BENICAR HCT) 20-12.5 MG per tablet Take 1 tablet by mouth every morning.        Allergies  Allergen Reactions  . Oxycodone-Acetaminophen Nausea And Vomiting  . Propoxyphene-Acetaminophen Nausea And Vomiting    Past Medical History  Diagnosis Date  . Coronary artery disease     Status post CABG, July 03, 1999  . Post PTCA     Status post PTCA and stenting of the proximal LAD-4 LIMA to LAD graft has become atretic after stenting of the proximal LAD , 2004  . Hyperlipidemia   . Hypertension   . Arthritis 07-21-12    osteoarthritis left hip    Past Surgical History  Procedure Date  . Cardiac catheterization     Ejection Fraction is  60-65%  . Coronary artery bypass graft 07-21-12    X4 vessel('00)/ '04 stent placed x1  . Total hip arthroplasty 07/28/2012    Procedure: TOTAL HIP ARTHROPLASTY;  Surgeon: Loanne Drilling, MD;  Location: [facility];  Service: Orthopedics;  Laterality: Left;    History  Smoking status  . Never Smoker   Smokeless tobacco  . Not on file    History  Alcohol Use No    Family History  Problem Relation Age of Onset  . Heart failure Father   . Stroke Sister     Reviw of Systems:  Reviewed in the HPI.  All other systems are negative.  Physical Exam: BP 152/89  Pulse 61  Ht 5' 2.5" (1.588 m)  Wt 147 lb 1.9 oz (66.733 kg)  BMI 26.48 kg/m2 The patient is alert and oriented x 3.  The mood and affect are normal.   Skin: warm and dry.  Color is normal.    HEENT:   the sclera are nonicteric.  The mucous membranes are moist.  The carotids are 2+ without bruits.  There is no thyromegaly.  There is no JVD.    Lungs: clear.  The chest wall is non tender.    Heart: regular rate with a normal S1 and S2.  There are no murmurs, gallops,  or rubs. The PMI is not displaced.     Abdomen: good bowel sounds.  There is no guarding or rebound.  There is no hepatosplenomegaly or tenderness.  There are no masses.   Extremities:  no clubbing, cyanosis, or edema.  The legs are without rashes.  The distal pulses are intact.   Neuro:  Cranial nerves II - XII are intact.  Motor and sensory functions are intact.    The gait is normal.  ECG: 10/23/2012-sinus bradycardia at 56 beats per minute. She has a left bundle branch block  Assessment / Plan:

## 2012-10-23 NOTE — Patient Instructions (Addendum)
Your physician wants you to follow-up in: 6 months  You will receive a reminder letter in the mail two months in advance. If you don't receive a letter, please call our office to schedule the follow-up appointment.  Your physician recommends that you return for a FASTING lipid profile: 6 months   

## 2012-10-23 NOTE — Assessment & Plan Note (Signed)
Erin Marshall is doing well from a cardiac standpoint.  She is not having any chest pain.  She had no complications with her hip surgery.  Continue current meds

## 2012-10-31 ENCOUNTER — Other Ambulatory Visit: Payer: Self-pay | Admitting: Cardiovascular Disease

## 2012-10-31 MED ORDER — OLMESARTAN MEDOXOMIL-HCTZ 20-12.5 MG PO TABS: 1.0000 | ORAL_TABLET | Freq: Every morning | ORAL | Status: DC

## 2012-11-19 ENCOUNTER — Other Ambulatory Visit: Payer: Self-pay | Admitting: *Deleted

## 2012-11-19 MED ORDER — ATENOLOL 25 MG PO TABS: 25.0000 mg | ORAL_TABLET | Freq: Every morning | ORAL | Status: DC

## 2012-11-19 NOTE — Telephone Encounter (Signed)
Fax Received. Refill Completed. Mindel Friscia Chowoe (R.M.A)   

## 2013-01-13 DIAGNOSIS — Z96649 Presence of unspecified artificial hip joint: Secondary | ICD-10-CM | POA: Diagnosis not present

## 2013-01-26 ENCOUNTER — Other Ambulatory Visit: Payer: Self-pay | Admitting: *Deleted

## 2013-01-26 MED ORDER — ATORVASTATIN CALCIUM 80 MG PO TABS: 40.0000 mg | ORAL_TABLET | Freq: Every morning | ORAL | Status: DC

## 2013-01-26 NOTE — Telephone Encounter (Signed)
Fax Received. Refill Completed. Erin Marshall (R.M.A)   

## 2013-01-27 ENCOUNTER — Other Ambulatory Visit: Payer: Self-pay | Admitting: *Deleted

## 2013-01-27 NOTE — Telephone Encounter (Signed)
Opened in Error.

## 2013-08-11 DIAGNOSIS — M169 Osteoarthritis of hip, unspecified: Secondary | ICD-10-CM | POA: Diagnosis not present

## 2013-08-28 ENCOUNTER — Telehealth: Payer: Self-pay | Admitting: Cardiovascular Disease

## 2013-08-28 NOTE — Telephone Encounter (Signed)
Called stating she has been up since 4 am with pain in center of pain and chest pressure.  Took NTG at 4 w/ some relief; took another 4:05 w/relief.  Went for short walk about 11:15 and had the heaviness in her chest again and center of back.  Took another NTG w/relief.  No pain now. No SOB.  Per Dr. Elease Hashimoto advised to go to [facility] ER. Spoke w/pt.  She states she doesn't want to go to ER and she may wait till happens again.  Advised that she should go on to ER.

## 2013-08-28 NOTE — Telephone Encounter (Signed)
New problem    C/O chest discomfort  Up at 3 am . Taken nitro @ 4:00  am  & 4:05 am  @ 11:00 am .

## 2013-09-10 DIAGNOSIS — R300 Dysuria: Secondary | ICD-10-CM | POA: Diagnosis not present

## 2013-09-14 ENCOUNTER — Ambulatory Visit: Payer: Medicare Other | Admitting: Physician Assistant

## 2013-10-06 ENCOUNTER — Telehealth: Payer: Self-pay

## 2013-10-06 MED ORDER — ISOSORBIDE MONONITRATE ER 30 MG PO TB24: 30.0000 mg | ORAL_TABLET | Freq: Every morning | ORAL | Status: DC

## 2013-10-06 NOTE — Telephone Encounter (Signed)
refill 

## 2013-10-07 DIAGNOSIS — Z23 Encounter for immunization: Secondary | ICD-10-CM | POA: Diagnosis not present

## 2013-10-29 ENCOUNTER — Encounter: Payer: Self-pay | Admitting: Cardiovascular Disease

## 2013-10-29 ENCOUNTER — Ambulatory Visit (INDEPENDENT_AMBULATORY_CARE_PROVIDER_SITE_OTHER): Payer: Medicare Other | Admitting: Cardiovascular Disease

## 2013-10-29 VITALS — BP 138/84 | HR 58 | Ht 62.5 in | Wt 145.8 lb

## 2013-10-29 DIAGNOSIS — E785 Hyperlipidemia, unspecified: Secondary | ICD-10-CM

## 2013-10-29 DIAGNOSIS — I2510 Atherosclerotic heart disease of native coronary artery without angina pectoris: Secondary | ICD-10-CM

## 2013-10-29 LAB — LIPID PANEL
Cholesterol: 139 mg/dL (ref 0–200)
HDL: 49.3 mg/dL (ref >39.00)
LDL Cholesterol: 73 mg/dL (ref 0–99)
VLDL: 16.6 mg/dL (ref 0.0–40.0)

## 2013-10-29 LAB — BASIC METABOLIC PANEL
CO2: 30 mEq/L (ref 19–32)
Chloride: 102 mEq/L (ref 96–112)
Glucose, Bld: 75 mg/dL (ref 70–99)
Potassium: 4 mEq/L (ref 3.5–5.1)
Sodium: 139 mEq/L (ref 135–145)

## 2013-10-29 LAB — HEPATIC FUNCTION PANEL
ALT: 23 U/L (ref 0–35)
AST: 24 U/L (ref 0–37)
Alkaline Phosphatase: 33 U/L — ABNORMAL LOW (ref 39–117)
Bilirubin, Direct: 0.1 mg/dL (ref 0.0–0.3)
Total Bilirubin: 0.8 mg/dL (ref 0.3–1.2)
Total Protein: 7.6 g/dL (ref 6.0–8.3)

## 2013-10-29 MED ORDER — ATENOLOL 25 MG PO TABS: 25.0000 mg | ORAL_TABLET | Freq: Every morning | ORAL | Status: DC

## 2013-10-29 MED ORDER — NITROGLYCERIN 0.4 MG SL SUBL: 0.4000 mg | SUBLINGUAL_TABLET | SUBLINGUAL | Status: DC | PRN

## 2013-10-29 MED ORDER — ATORVASTATIN CALCIUM 80 MG PO TABS: 40.0000 mg | ORAL_TABLET | Freq: Every morning | ORAL | Status: DC

## 2013-10-29 MED ORDER — OLMESARTAN MEDOXOMIL-HCTZ 20-12.5 MG PO TABS: 1.0000 | ORAL_TABLET | Freq: Every morning | ORAL | Status: DC

## 2013-10-29 MED ORDER — ISOSORBIDE MONONITRATE ER 30 MG PO TB24: 30.0000 mg | ORAL_TABLET | Freq: Every morning | ORAL | Status: DC

## 2013-10-29 NOTE — Patient Instructions (Signed)
Your physician wants you to follow-up in: 1 YEAR  WITH EKG You will receive a reminder letter in the mail two months in advance. If you don't receive a letter, please call our office to schedule the follow-up appointment.  Your physician recommends that you return for a FASTING lipid profile: TODAY AND IN 1 YEAR

## 2013-10-29 NOTE — Progress Notes (Signed)
Erin Marshall Date of Birth  Jun 18, 1946 [facility] [address]     [address] [place], [place]  [address] [phone]  Fax  [phone]   Problem List: 1. CAD, CABG- July 2000 2. HTN 3. Left hip relacement   History of Present Illness:  67 year old female with a history of coronary artery disease feet she status post coronary artery bypass grafting in 2000 with stenting of the LAD in 2004. 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  She's done well since I last saw her. She's not had any episodes of chest pain or shortness of breath.  She has had left hip replacement in August.  She was on Xarelto for a month or so afterwards.  Nov. 13, 2014:  Erin Marshall is doing well.  No CP or problems.  She has some back pain  Current Outpatient Prescriptions on File Prior to Visit  Medication Sig Dispense Refill  . aspirin 81 MG tablet Take 81 mg by mouth daily.      Marland Kitchen atenolol (TENORMIN) 25 MG tablet Take 1 tablet (25 mg total) by mouth every morning.  90 tablet  3  . atorvastatin (LIPITOR) 80 MG tablet Take 0.5 tablets (40 mg total) by mouth every morning.  90 tablet  3  . isosorbide mononitrate (IMDUR) 30 MG 24 hr tablet Take 1 tablet (30 mg total) by mouth every morning.  60 tablet  0  . nitroGLYCERIN (NITROSTAT) 0.4 MG SL tablet Place 1 tablet (0.4 mg total) under the tongue every 5 (five) minutes as needed. For chest pain  25 tablet  4  . olmesartan-hydrochlorothiazide (BENICAR HCT) 20-12.5 MG per tablet Take 1 tablet by mouth every morning.  90 tablet  3   No current facility-administered medications on file prior to visit.    Allergies  Allergen Reactions  . Oxycodone-Acetaminophen Nausea And Vomiting  . Propoxyphene-Acetaminophen Nausea And Vomiting    Past Medical History  Diagnosis Date  . Coronary artery disease     Status post CABG, July 03, 1999  . Post PTCA     Status post PTCA and stenting of the proximal LAD-4  LIMA to LAD graft has become atretic after stenting of the proximal LAD , 2004  . Hyperlipidemia   . Hypertension   . Arthritis 07-21-12    osteoarthritis left hip    Past Surgical History  Procedure Laterality Date  . Cardiac catheterization      Ejection Fraction is 60-65%  . Coronary artery bypass graft  07-21-12    X4 vessel('00)/ '04 stent placed x1  . Total hip arthroplasty  07/28/2012    Procedure: TOTAL HIP ARTHROPLASTY;  Surgeon: Loanne Drilling, MD;  Location: [facility];  Service: Orthopedics;  Laterality: Left;    History  Smoking status  . Never Smoker   Smokeless tobacco  . Not on file    History  Alcohol Use No    Family History  Problem Relation Age of Onset  . Heart failure Father   . Stroke Sister     Reviw of Systems:  Reviewed in the HPI.  All other systems are negative.  Physical Exam: BP 138/84  Pulse 58  Ht 5' 2.5" (1.588 m)  Wt 145 lb 12.8 oz (66.134 kg)  BMI 26.23 kg/m2 The patient is alert and oriented x 3.  The mood and affect are normal.   Skin: warm and dry.  Color is normal.    HEENT:  the sclera are nonicteric.  The mucous membranes are moist.  The carotids are 2+ without bruits.  There is no thyromegaly.  There is no JVD.    Lungs: clear.  The chest wall is non tender.    Heart: regular rate with a normal S1 and S2.  There are no murmurs, gallops, or rubs. The PMI is not displaced.     Abdomen: good bowel sounds.  There is no guarding or rebound.  There is no hepatosplenomegaly or tenderness.  There are no masses.   Extremities:  no clubbing, cyanosis, or edema.  The legs are without rashes.  The distal pulses are intact.   Neuro:  Cranial nerves II - XII are intact.  Motor and sensory functions are intact.    The gait is normal.  ECG: 10/29/2013-sinus bradycardia at 58 beats per minute. She has a left bundle branch block.  Assessment / Plan:

## 2014-04-08 IMAGING — CR DG CHEST 2V
2 series · 2 of 2 positions shown · non-contrast
Comparison: 10/02/2006

CLINICAL DATA: Preop for left hip replacement

CHEST - 2 VIEW

[w chest pa]
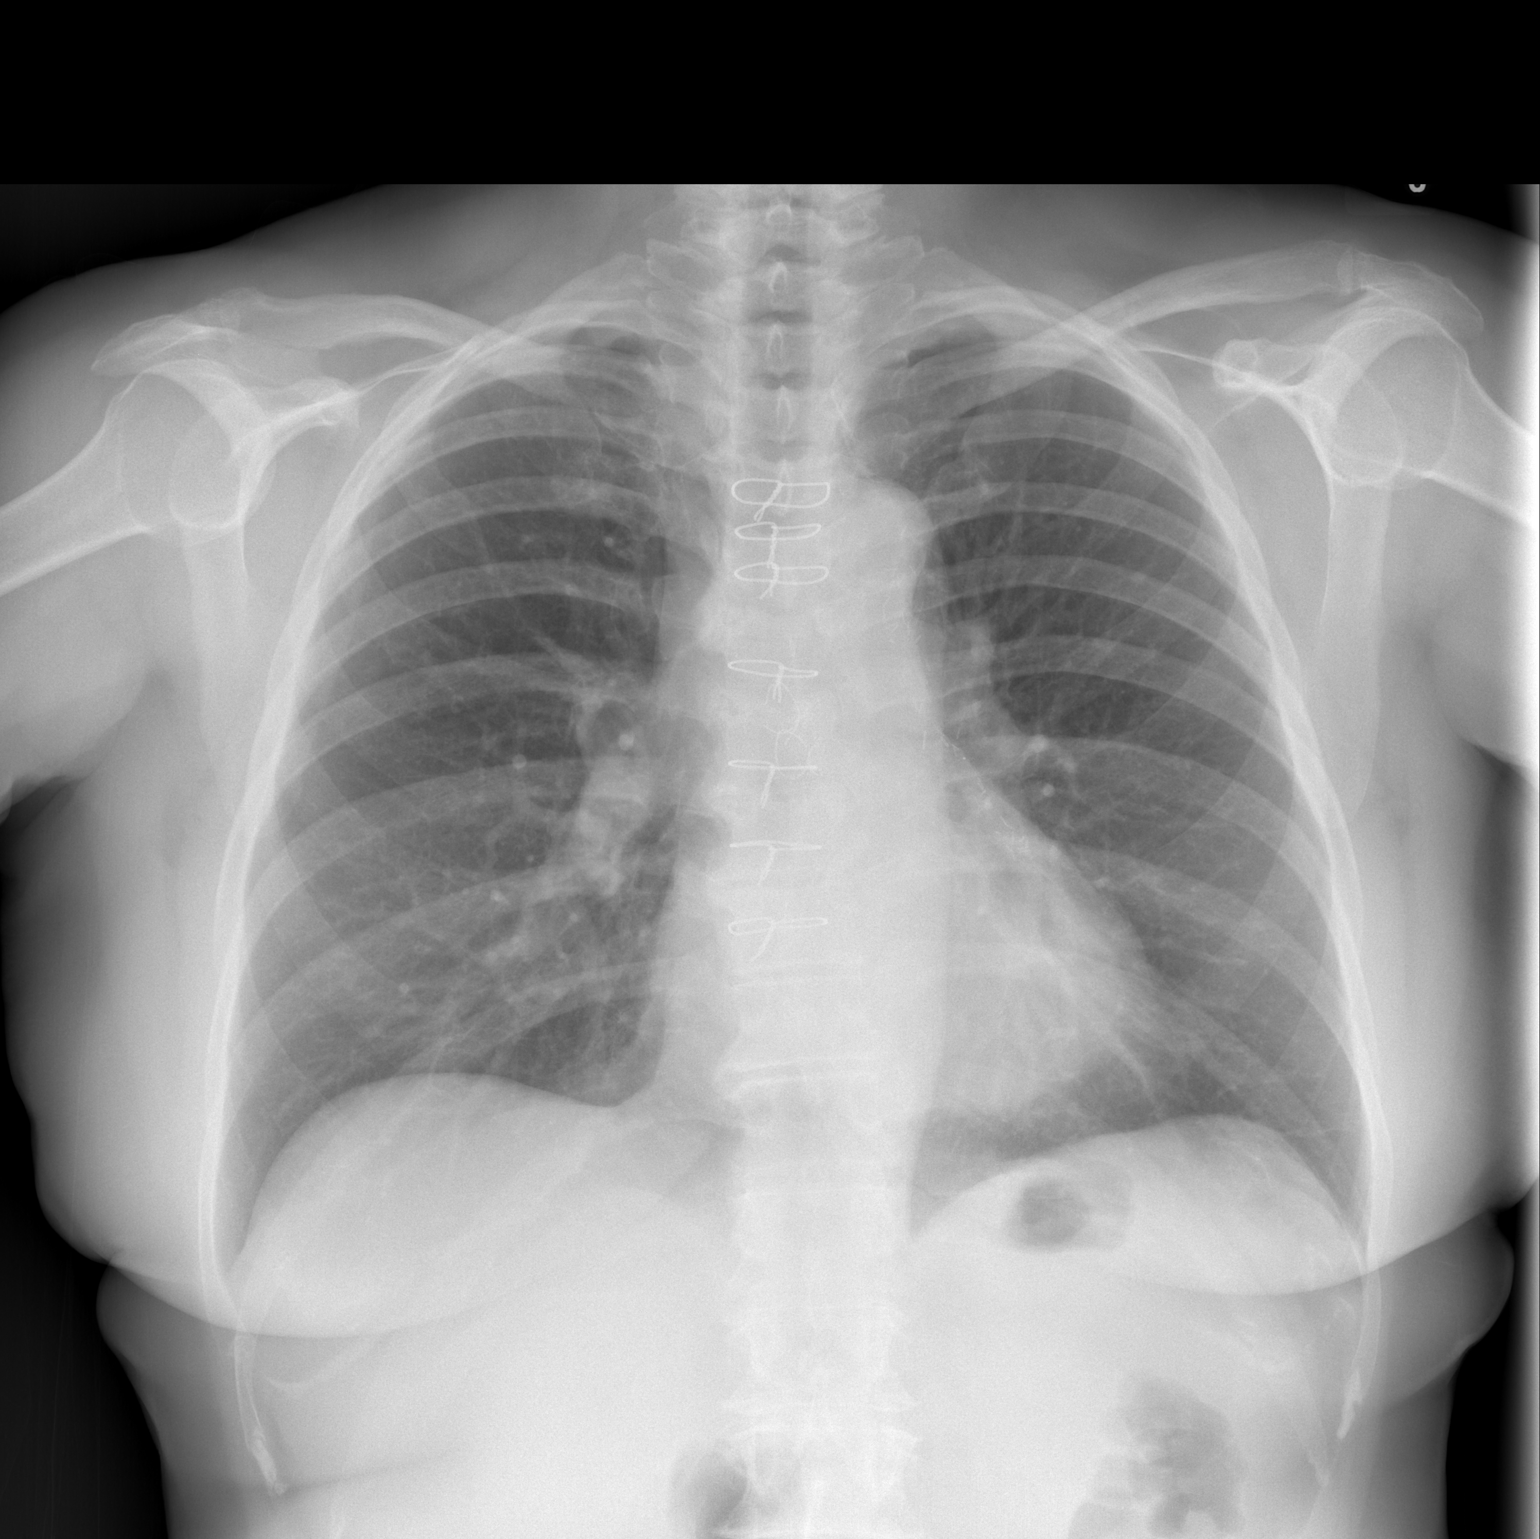

[w chest lat]
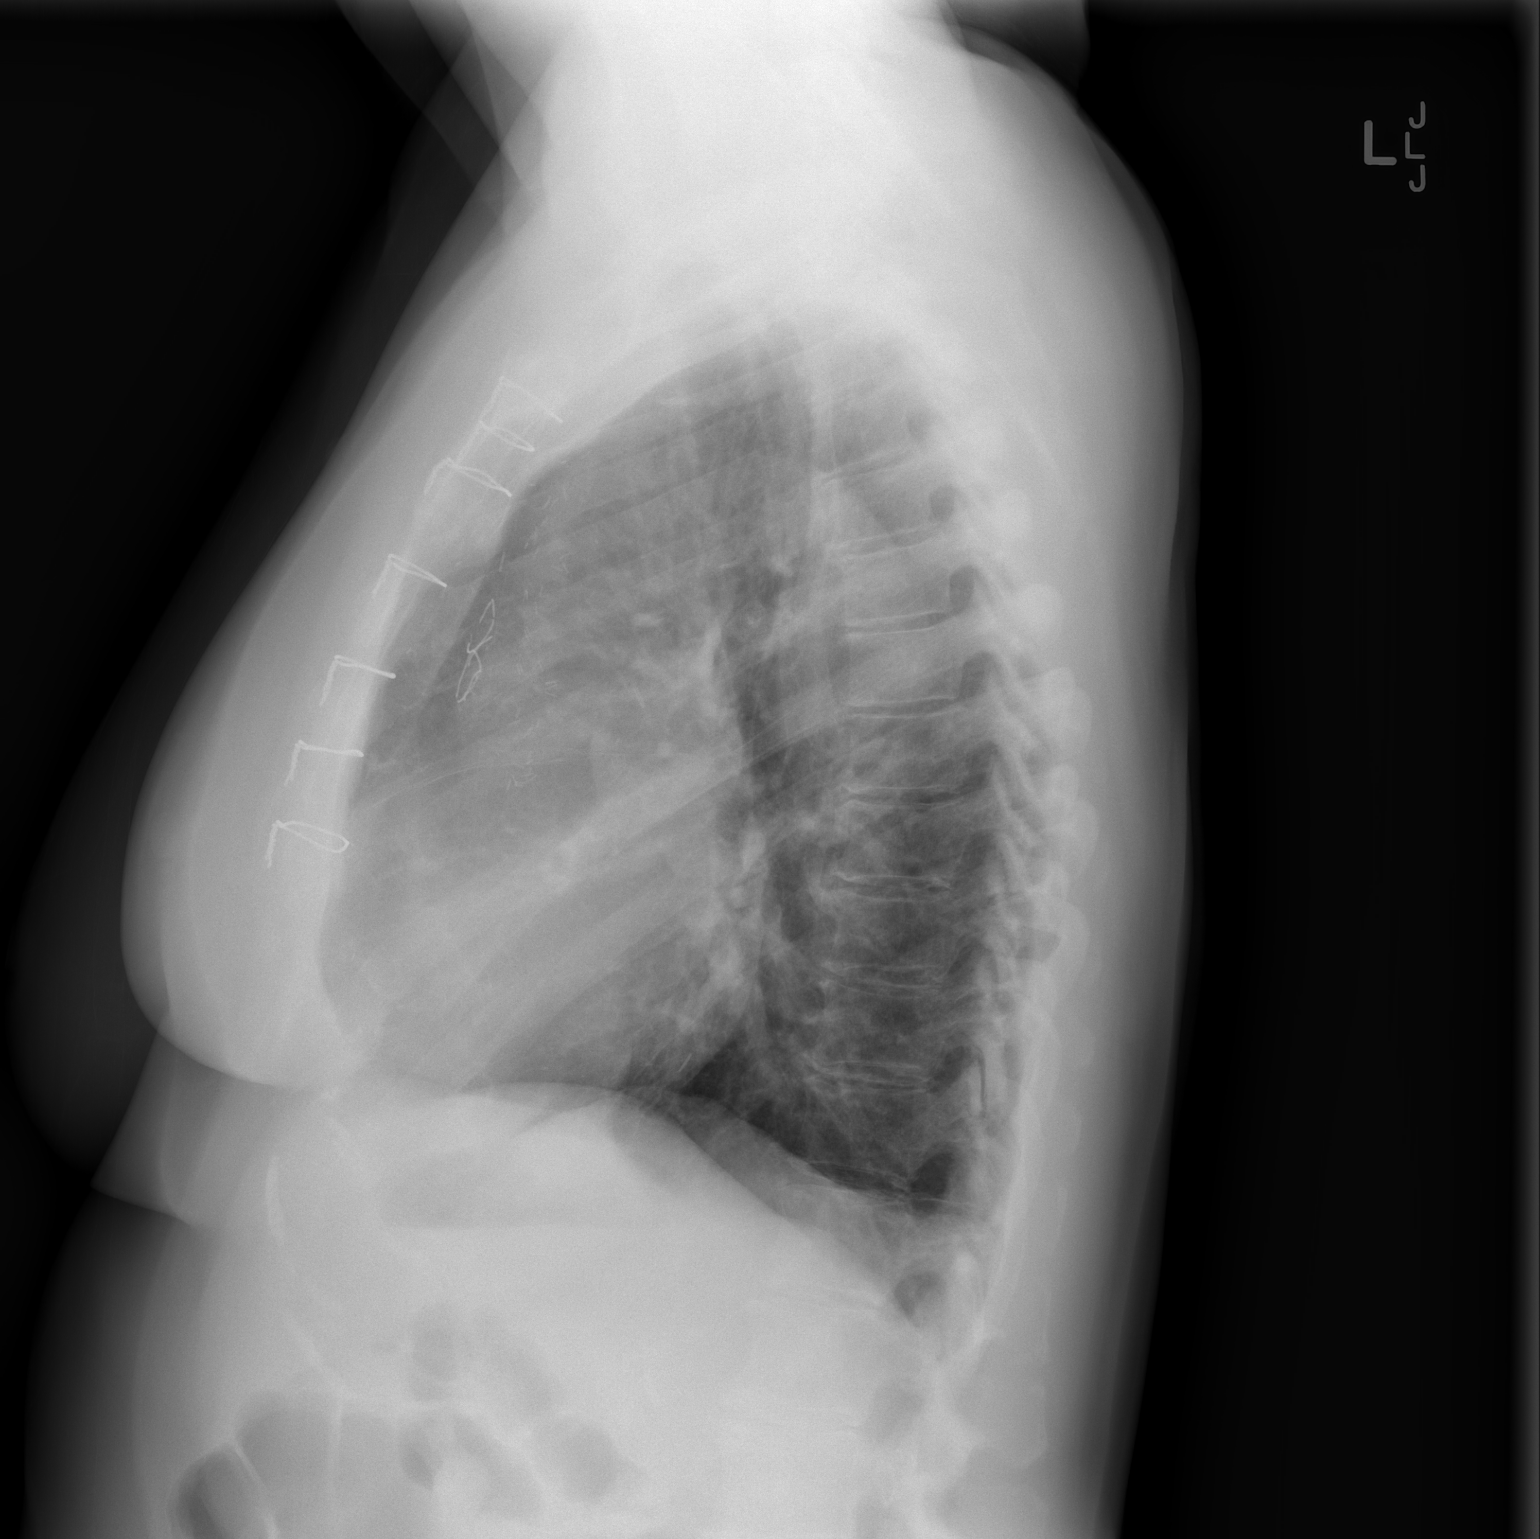

[2 of 2 positions shown; findings below may reference images not displayed]

FINDINGS: Cardiomediastinal silhouette is stable.  Status post
CABG.  No acute infiltrate or pleural effusion.  No pulmonary
edema.  Stable mild degenerative changes thoracic spine.
IMPRESSION: No active disease.  No significant change.

## 2014-09-23 DIAGNOSIS — Z23 Encounter for immunization: Secondary | ICD-10-CM | POA: Diagnosis not present

## 2014-10-25 ENCOUNTER — Other Ambulatory Visit (INDEPENDENT_AMBULATORY_CARE_PROVIDER_SITE_OTHER): Payer: Medicare Other | Admitting: *Deleted

## 2014-10-25 ENCOUNTER — Ambulatory Visit: Payer: Medicare Other | Admitting: Cardiovascular Disease

## 2014-10-25 ENCOUNTER — Other Ambulatory Visit: Payer: Self-pay | Admitting: Cardiovascular Disease

## 2014-10-25 DIAGNOSIS — E785 Hyperlipidemia, unspecified: Secondary | ICD-10-CM

## 2014-10-25 DIAGNOSIS — E871 Hypo-osmolality and hyponatremia: Secondary | ICD-10-CM

## 2014-10-25 LAB — HEPATIC FUNCTION PANEL
ALT: 34 U/L (ref 0–35)
AST: 34 U/L (ref 0–37)
Albumin: 3.7 g/dL (ref 3.5–5.2)
Alkaline Phosphatase: 36 U/L — ABNORMAL LOW (ref 39–117)
BILIRUBIN DIRECT: 0 mg/dL (ref 0.0–0.3)
Total Bilirubin: 0.8 mg/dL (ref 0.2–1.2)
Total Protein: 7.6 g/dL (ref 6.0–8.3)

## 2014-10-25 LAB — LIPID PANEL
CHOL/HDL RATIO: 3
Cholesterol: 129 mg/dL (ref 0–200)
HDL: 43.2 mg/dL (ref >39.00)
LDL CALC: 61 mg/dL (ref 0–99)
NonHDL: 85.8
TRIGLYCERIDES: 123 mg/dL (ref 0.0–149.0)
VLDL: 24.6 mg/dL (ref 0.0–40.0)

## 2014-10-25 LAB — BASIC METABOLIC PANEL
BUN: 22 mg/dL (ref 6–23)
CO2: 28 mEq/L (ref 19–32)
CREATININE: 0.9 mg/dL (ref 0.4–1.2)
Calcium: 10.2 mg/dL (ref 8.4–10.5)
Chloride: 103 mEq/L (ref 96–112)
GFR: 64.01 mL/min (ref >60.00)
Glucose, Bld: 99 mg/dL (ref 70–99)
Potassium: 4.4 mEq/L (ref 3.5–5.1)
Sodium: 139 mEq/L (ref 135–145)

## 2014-10-26 ENCOUNTER — Other Ambulatory Visit: Payer: Self-pay

## 2014-10-29 ENCOUNTER — Ambulatory Visit (INDEPENDENT_AMBULATORY_CARE_PROVIDER_SITE_OTHER): Payer: Medicare Other | Admitting: Cardiovascular Disease

## 2014-10-29 ENCOUNTER — Encounter: Payer: Self-pay | Admitting: Cardiovascular Disease

## 2014-10-29 VITALS — BP 130/86 | HR 76 | Ht 62.5 in | Wt 151.6 lb

## 2014-10-29 DIAGNOSIS — I10 Essential (primary) hypertension: Secondary | ICD-10-CM

## 2014-10-29 DIAGNOSIS — I25810 Atherosclerosis of coronary artery bypass graft(s) without angina pectoris: Secondary | ICD-10-CM | POA: Diagnosis not present

## 2014-10-29 NOTE — Progress Notes (Signed)
Erin Marshall Date of Birth  30-May-1946 [facility] / [facility] [address]     [place], [place]  [address] [phone]  Fax  [phone]   Problem List: 1. CAD, CABG- July 2000 2. HTN 3. Left hip relacement   History of Present Illness:  68 year old female with a history of coronary artery disease feet she status post coronary artery bypass grafting in 2000 with stenting of the LAD in 2004. 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  She's done well since I last saw her. She's not had any episodes of chest pain or shortness of breath.  She has had left hip replacement in August.  She was on Xarelto for a month or so afterwards.  Nov. 13, 2014:  Erin Marshall is doing well.  No CP or problems.  She has some back pain  Nov. 3, 1475: 68 year old female with a history of coronary artery disease feet she status post coronary artery bypass grafting in 2000 with stenting of the LAD in 2004. 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 Doing well .  Doing well.  Had her hip replaced 2 years ago and is doing well.  Doing lots of yard work without issues. No CP no dyspnea .    Current Outpatient Prescriptions on File Prior to Visit  Medication Sig Dispense Refill  . aspirin 81 MG tablet Take 81 mg by mouth daily.    Marland Kitchen atenolol (TENORMIN) 25 MG tablet TAKE 1 TABLET (25 MG TOTAL) BY MOUTH EVERY MORNING. 90 tablet 0  . atorvastatin (LIPITOR) 80 MG tablet Take 0.5 tablets (40 mg total) by mouth every morning. 90 tablet 3  . BENICAR HCT 20-12.5 MG per tablet TAKE 1 TABLET EVERY MORNING 90 tablet 0  . isosorbide mononitrate (IMDUR) 30 MG 24 hr tablet Take 1 tablet (30 mg total) by mouth every morning. 90 tablet 3  . nitroGLYCERIN (NITROSTAT) 0.4 MG SL tablet Place 1 tablet (0.4 mg total) under the tongue every 5 (five) minutes as needed. For chest pain 25 tablet 4   No current facility-administered medications on file prior to visit.     Allergies  Allergen Reactions  . Oxycodone-Acetaminophen Nausea And Vomiting  . Propoxyphene N-Acetaminophen Nausea And Vomiting    Past Medical History  Diagnosis Date  . Coronary artery disease     Status post CABG, July 03, 1999  . Post PTCA     Status post PTCA and stenting of the proximal LAD-4 LIMA to LAD graft has become atretic after stenting of the proximal LAD , 2004  . Hyperlipidemia   . Hypertension   . Arthritis 07-21-12    osteoarthritis left hip    Past Surgical History  Procedure Laterality Date  . Cardiac catheterization      Ejection Fraction is 60-65%  . Coronary artery bypass graft  07-21-12    X4 vessel('00)/ '04 stent placed x1  . Total hip arthroplasty  07/28/2012    Procedure: TOTAL HIP ARTHROPLASTY;  Surgeon: Gearlean Alf, MD;  Location: [facility];  Service: Orthopedics;  Laterality: Left;    History  Smoking status  . Never Smoker   Smokeless tobacco  . Not on file    History  Alcohol Use No    Family History  Problem Relation Age of Onset  . Heart failure Father   . Stroke Sister     Reviw of Systems:  Reviewed in the HPI.  All other systems are  negative.  Physical Exam: BP 130/86 mmHg  Pulse 76  Ht 5' 2.5" (1.588 m)  Wt 151 lb 9.6 oz (68.765 kg)  BMI 27.27 kg/m2 The patient is alert and oriented x 3.  The mood and affect are normal.   Skin: warm and dry.  Color is normal.    HEENT:   the sclera are nonicteric.  The mucous membranes are moist.  The carotids are 2+ without bruits.  There is no thyromegaly.  There is no JVD.    Lungs: clear.  The chest wall is non tender.    Heart: regular rate with a normal S1 and S2.  There are no murmurs, gallops, or rubs. The PMI is not displaced.     Abdomen: good bowel sounds.  There is no guarding or rebound.  There is no hepatosplenomegaly or tenderness.  There are no masses.   Extremities:  no clubbing, cyanosis, or edema.  The legs are without rashes.  The distal pulses are intact.    Neuro:  Cranial nerves II - XII are intact.  Motor and sensory functions are intact.    The gait is normal.  ECG: 10/29/2014-  Normal sinus rhythm at 76. She has a left bundle branch block.  Assessment / Plan:

## 2014-10-29 NOTE — Assessment & Plan Note (Signed)
Erin Marshall is doing well.   No CP or dyspnea. Able to do all of her normal activities  without problems Lipids are well controlled.  Continue current meds Will see her in 1 year

## 2014-10-29 NOTE — Patient Instructions (Addendum)
Ask your pharmacist the price of Valsartan- HCTZ.  Your physician wants you to follow-up in: 1 year with Dr. Acie Fredrickson.  You will receive a reminder letter in the mail two months in advance. If you don't receive a letter, please call our office to schedule the follow-up appointment.

## 2014-11-01 ENCOUNTER — Telehealth: Payer: Self-pay | Admitting: Cardiovascular Disease

## 2014-11-01 DIAGNOSIS — I10 Essential (primary) hypertension: Secondary | ICD-10-CM

## 2014-11-01 NOTE — Telephone Encounter (Addendum)
Patient called to inform us that her pharmacy does carry Valsartan 80/12.5 mg only and it is much cheaper than Benicar HCT.  Patient would like to switch but needs Dr. Acie Fredrickson to advise her on dosage.  I advised patient that I will send to Dr. Acie Fredrickson for his advice and will call her back with his recommendation.  Patient verbalized understanding and agreement.

## 2014-11-01 NOTE — Telephone Encounter (Signed)
New message    Patient wants to discuss changing medication .

## 2014-11-01 NOTE — Telephone Encounter (Signed)
Left message for patient to call office.  

## 2014-11-02 ENCOUNTER — Telehealth: Payer: Self-pay | Admitting: Nurse Practitioner

## 2014-11-02 MED ORDER — VALSARTAN-HYDROCHLOROTHIAZIDE 80-12.5 MG PO TABS: 1.0000 | ORAL_TABLET | Freq: Every day | ORAL | Status: DC

## 2014-11-02 NOTE — Telephone Encounter (Signed)
Spoke with patient's husband who states patient is doing yard work and advised him to talk to me about the Valsartan Rx.  I reviewed Dr. Elmarie Shiley instructions with him and he verbalized understanding and agreement.  I advised that patient may call me back today with questions or concerns.

## 2014-11-02 NOTE — Addendum Note (Signed)
Addended by: Emmaline Life on: 11/02/2014 10:18 AM   Modules accepted: Orders, Medications

## 2014-11-02 NOTE — Telephone Encounter (Signed)
Received call from patient who states she has picked up Valsartan/HCT but has 13 pills of the Benicar left and would like to finish them.  I advised that she may finish those and we agreed to move her nurse visit and lab appointments out 2 weeks so that she may finish the Benicar.  Patient verbalized understanding and gratitude.

## 2014-11-02 NOTE — Telephone Encounter (Signed)
DC benicar / HCTZ and  Start Valsartan / HCTZ 80/12.5 a day Nurse visit in 3-4 weeks for BP and BMP

## 2014-11-24 ENCOUNTER — Other Ambulatory Visit: Payer: PRIVATE HEALTH INSURANCE

## 2014-11-26 ENCOUNTER — Other Ambulatory Visit: Payer: Self-pay | Admitting: Cardiovascular Disease

## 2014-12-14 ENCOUNTER — Ambulatory Visit: Payer: Medicare Other

## 2014-12-14 ENCOUNTER — Other Ambulatory Visit (INDEPENDENT_AMBULATORY_CARE_PROVIDER_SITE_OTHER): Payer: Medicare Other | Admitting: *Deleted

## 2014-12-14 VITALS — BP 132/86 | HR 72 | Resp 12

## 2014-12-14 DIAGNOSIS — I10 Essential (primary) hypertension: Secondary | ICD-10-CM

## 2014-12-14 LAB — BASIC METABOLIC PANEL
BUN: 22 mg/dL (ref 6–23)
CALCIUM: 9.9 mg/dL (ref 8.4–10.5)
CO2: 29 mEq/L (ref 19–32)
Chloride: 104 mEq/L (ref 96–112)
Creatinine, Ser: 0.9 mg/dL (ref 0.4–1.2)
GFR: 65.63 mL/min (ref >60.00)
GLUCOSE: 77 mg/dL (ref 70–99)
POTASSIUM: 3.9 meq/L (ref 3.5–5.1)
Sodium: 140 mEq/L (ref 135–145)

## 2014-12-14 NOTE — Progress Notes (Signed)
Patient here for BP check after finishing Benicar and starting Diovan HCT 80/12.5mg . She is feeling well and has no complaints. Has been taking it approx 1 week.

## 2015-01-19 ENCOUNTER — Other Ambulatory Visit: Payer: Self-pay | Admitting: Cardiovascular Disease

## 2015-01-27 ENCOUNTER — Other Ambulatory Visit: Payer: Self-pay | Admitting: Cardiovascular Disease

## 2015-01-28 ENCOUNTER — Other Ambulatory Visit: Payer: Self-pay

## 2015-01-28 MED ORDER — ATENOLOL 25 MG PO TABS: ORAL_TABLET | ORAL | Status: DC

## 2015-01-31 ENCOUNTER — Other Ambulatory Visit: Payer: Self-pay | Admitting: Nurse Practitioner

## 2015-02-07 ENCOUNTER — Other Ambulatory Visit: Payer: Self-pay | Admitting: Cardiovascular Disease

## 2015-05-12 DIAGNOSIS — S41102A Unspecified open wound of left upper arm, initial encounter: Secondary | ICD-10-CM | POA: Diagnosis not present

## 2015-09-26 ENCOUNTER — Other Ambulatory Visit: Payer: Self-pay | Admitting: Cardiovascular Disease

## 2015-10-13 DIAGNOSIS — Z23 Encounter for immunization: Secondary | ICD-10-CM | POA: Diagnosis not present

## 2015-10-21 ENCOUNTER — Other Ambulatory Visit: Payer: Self-pay | Admitting: Cardiovascular Disease

## 2015-10-31 ENCOUNTER — Encounter: Payer: Self-pay | Admitting: Cardiovascular Disease

## 2015-10-31 ENCOUNTER — Ambulatory Visit (INDEPENDENT_AMBULATORY_CARE_PROVIDER_SITE_OTHER): Payer: Medicare Other | Admitting: Cardiovascular Disease

## 2015-10-31 VITALS — BP 130/80 | HR 61 | Ht 62.5 in | Wt 147.4 lb

## 2015-10-31 DIAGNOSIS — E785 Hyperlipidemia, unspecified: Secondary | ICD-10-CM | POA: Diagnosis not present

## 2015-10-31 DIAGNOSIS — I25810 Atherosclerosis of coronary artery bypass graft(s) without angina pectoris: Secondary | ICD-10-CM

## 2015-10-31 LAB — COMPREHENSIVE METABOLIC PANEL
ALT: 29 U/L (ref 6–29)
AST: 34 U/L (ref 10–35)
Albumin: 4.4 g/dL (ref 3.6–5.1)
Alkaline Phosphatase: 31 U/L — ABNORMAL LOW (ref 33–130)
BUN: 19 mg/dL (ref 7–25)
CO2: 26 mmol/L (ref 20–31)
Calcium: 10.3 mg/dL (ref 8.6–10.4)
Chloride: 102 mmol/L (ref 98–110)
Creat: 0.81 mg/dL (ref 0.60–0.93)
GLUCOSE: 79 mg/dL (ref 65–99)
Potassium: 4.3 mmol/L (ref 3.5–5.3)
Sodium: 139 mmol/L (ref 135–146)
TOTAL PROTEIN: 7.6 g/dL (ref 6.1–8.1)
Total Bilirubin: 0.6 mg/dL (ref 0.2–1.2)

## 2015-10-31 LAB — LIPID PANEL
Cholesterol: 124 mg/dL — ABNORMAL LOW (ref 125–200)
HDL: 52 mg/dL (ref >=46)
LDL CALC: 52 mg/dL (ref <130)
TRIGLYCERIDES: 100 mg/dL (ref <150)
Total CHOL/HDL Ratio: 2.4 Ratio (ref <=5.0)
VLDL: 20 mg/dL (ref <30)

## 2015-10-31 NOTE — Patient Instructions (Signed)
Medication Instructions:  Your physician recommends that you continue on your current medications as directed. Please refer to the Current Medication list given to you today.   Labwork: TODAY - cholesterol, liver, basic metabolic panel  Your physician recommends that you return for lab work in: 1 year on the day of or a few days before your office visit with Dr. Acie Fredrickson.  You will need to FAST for this appointment - nothing to eat or drink after midnight the night before except water.   Testing/Procedures: None Ordered   Follow-Up: Your physician wants you to follow-up in: 1 year with Dr. Acie Fredrickson.  You will receive a reminder letter in the mail two months in advance. If you don't receive a letter, please call our office to schedule the follow-up appointment.   If you need a refill on your cardiac medications before your next appointment, please call your pharmacy.   Thank you for choosing [facility]! Christen Bame, RN [phone]

## 2015-10-31 NOTE — Progress Notes (Signed)
Erin Marshall Date of Birth  05/25/1946 [facility] / [facility] [id number]     [place], [place]  [address] [phone]  Fax  [phone]   Problem List: 1. CAD, CABG- July 2000 2. HTN 3. Left hip relacement   History of Present Illness:  69 year old female with a history of coronary artery disease feet she status post coronary artery bypass grafting in 2000 with stenting of the LAD in 2004. 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  She's done well since I last saw her. She's not had any episodes of chest pain or shortness of breath.  She has had left hip replacement in August.  She was on Xarelto for a month or so afterwards.  Nov. 13, 2014:  Erin Marshall is doing well.  No CP or problems.  She has some back pain  Nov. 86, 5682: 69 year old female with a history of coronary artery disease feet she status post coronary artery bypass grafting in 2000 with stenting of the LAD in 2004. 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 Doing well .  Doing well.  Had her hip replaced 2 years ago and is doing well.  Doing lots of yard work without issues. No CP no dyspnea .   Nov. 14, 2016:  Doing well.  Lots of yard work .   No CP  Had CABG 16 years ago .  bp is a bit high here.    Current Outpatient Prescriptions on File Prior to Visit  Medication Sig Dispense Refill  . aspirin 81 MG tablet Take 81 mg by mouth daily.    Marland Kitchen atenolol (TENORMIN) 25 MG tablet TAKE 1 TABLET (25 MG TOTAL) BY MOUTH EVERY MORNING. 90 tablet 1  . atorvastatin (LIPITOR) 80 MG tablet TAKE 0.5 TABLETS (40 MG TOTAL) BY MOUTH EVERY MORNING. 90 tablet 2  . isosorbide mononitrate (IMDUR) 30 MG 24 hr tablet TAKE 1 TABLET (30 MG TOTAL) BY MOUTH EVERY MORNING. 90 tablet 3  . nitroGLYCERIN (NITROSTAT) 0.4 MG SL tablet Place 1 tablet (0.4 mg total) under the tongue every 5 (five) minutes as needed. For chest pain 25 tablet 4  . valsartan-hydrochlorothiazide  (DIOVAN-HCT) 80-12.5 MG tablet TAKE 1 TABLET BY MOUTH DAILY. 90 tablet 1   No current facility-administered medications on file prior to visit.    Allergies  Allergen Reactions  . Oxycodone-Acetaminophen Nausea And Vomiting  . Propoxyphene N-Acetaminophen Nausea And Vomiting    Past Medical History  Diagnosis Date  . Coronary artery disease     Status post CABG, July 03, 1999  . Post PTCA     Status post PTCA and stenting of the proximal LAD-4 LIMA to LAD graft has become atretic after stenting of the proximal LAD , 2004  . Hyperlipidemia   . Hypertension   . Arthritis 07-21-12    osteoarthritis left hip    Past Surgical History  Procedure Laterality Date  . Cardiac catheterization      Ejection Fraction is 60-65%  . Coronary artery bypass graft  07-21-12    X4 vessel('00)/ '04 stent placed x1  . Total hip arthroplasty  07/28/2012    Procedure: TOTAL HIP ARTHROPLASTY;  Surgeon: Gearlean Alf, MD;  Location: [facility];  Service: Orthopedics;  Laterality: Left;    History  Smoking status  . Never Smoker   Smokeless tobacco  . Not on file    History  Alcohol Use No    Family History  Problem Relation Age of Onset  . Heart failure Father   . Stroke Sister     Reviw of Systems:  Reviewed in the HPI.  All other systems are negative.  Physical Exam: BP 130/80 mmHg  Pulse 61  Ht 5' 2.5" (1.588 m)  Wt 147 lb 6.4 oz (66.86 kg)  BMI 26.51 kg/m2 The patient is alert and oriented x 3.  The mood and affect are normal.   Skin: warm and dry.  Color is normal.    HEENT:   the sclera are nonicteric.  The mucous membranes are moist.  The carotids are 2+ without bruits.  There is no thyromegaly.  There is no JVD.    Lungs: clear.  The chest wall is non tender.    Heart: regular rate with a normal S1 and S2.  There are no murmurs, gallops, or rubs. The PMI is not displaced.     Abdomen: good bowel sounds.  There is no guarding or rebound.  There is no hepatosplenomegaly or  tenderness.  There are no masses.   Extremities:  no clubbing, cyanosis, or edema.  The legs are without rashes.  The distal pulses are intact.   Neuro:  Cranial nerves II - XII are intact.  Motor and sensory functions are intact.    The gait is normal.  ECG:  10/31/2015: Normal sinus rhythm at 61. She has a left bundle-branch block.   Assessment / Plan:   1. CAD, CABG- July 2000-   She's not having any episodes of angina. Her blood pressure is well-controlled. I advised her to be careful about eating too much salt. We'll check fasting labs today.    2. HTN-  Blood pressures fairly well-controlled. Continue current medications.  3. Left hip relacement    Erin Marshall, Wonda Cheng, MD  10/31/2015 2:42 PM    [facility] [place],  [place], [place]  [address] Pager [phone] Phone: [phone]; Fax: [phone]   [facility]  [address] [place], [place]  [address] [phone]   Fax [phone]

## 2016-04-06 ENCOUNTER — Other Ambulatory Visit: Payer: Self-pay | Admitting: Cardiovascular Disease

## 2016-04-14 ENCOUNTER — Other Ambulatory Visit: Payer: Self-pay | Admitting: Cardiovascular Disease

## 2016-09-04 DIAGNOSIS — Z23 Encounter for immunization: Secondary | ICD-10-CM | POA: Diagnosis not present

## 2016-09-04 DIAGNOSIS — L280 Lichen simplex chronicus: Secondary | ICD-10-CM | POA: Diagnosis not present

## 2016-10-11 ENCOUNTER — Other Ambulatory Visit: Payer: Self-pay | Admitting: Cardiovascular Disease

## 2016-10-31 ENCOUNTER — Ambulatory Visit (INDEPENDENT_AMBULATORY_CARE_PROVIDER_SITE_OTHER): Payer: Medicare Other | Admitting: Cardiovascular Disease

## 2016-10-31 ENCOUNTER — Encounter: Payer: Self-pay | Admitting: Cardiovascular Disease

## 2016-10-31 VITALS — BP 146/90 | HR 60 | Ht 62.5 in | Wt 151.2 lb

## 2016-10-31 DIAGNOSIS — I2510 Atherosclerotic heart disease of native coronary artery without angina pectoris: Secondary | ICD-10-CM

## 2016-10-31 DIAGNOSIS — I10 Essential (primary) hypertension: Secondary | ICD-10-CM

## 2016-10-31 MED ORDER — HYDROCHLOROTHIAZIDE 25 MG PO TABS: 25.0000 mg | ORAL_TABLET | Freq: Every day | ORAL | 3 refills | Status: DC

## 2016-10-31 MED ORDER — VALSARTAN 80 MG PO TABS: 80.0000 mg | ORAL_TABLET | Freq: Every day | ORAL | 3 refills | Status: DC

## 2016-10-31 NOTE — Patient Instructions (Addendum)
Medication Instructions:  STOP Valsartan/HCTZ TAKE Valsartan 80 mg once daily TAKE HCTZ 25 mg once daily   Labwork: Your physician recommends that you return for lab work in: 3 weeks after you start the increased dose of HCTZ - call to schedule   Testing/Procedures: None Ordered   Follow-Up: Your physician wants you to follow-up in: 1 year with Dr. Acie Fredrickson.  You will receive a reminder letter in the mail two months in advance. If you don't receive a letter, please call our office to schedule the follow-up appointment.   If you need a refill on your cardiac medications before your next appointment, please call your pharmacy.   Thank you for choosing [facility]! Christen Bame, RN [phone]

## 2016-10-31 NOTE — Progress Notes (Signed)
Erin Marshall Date of Birth  1946/12/03 [facility] / [facility] [id number]     [place], [place]  [address] [phone]  Fax  [phone]   Problem List: 1. CAD, CABG- July 2000 2. HTN 3. Left hip relacement   Previous Notes:    70 year old female with a history of coronary artery disease feet she status post coronary artery bypass grafting in 2000 with stenting of the LAD in 2004. 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  She's done well since I last saw her. She's not had any episodes of chest pain or shortness of breath.  She has had left hip replacement in August.  She was on Xarelto for a month or so afterwards.  Nov. 13, 2014:  Erin Marshall is doing well.  No CP or problems.  She has some back pain  Nov. 15, 5136: 70 year old female with a history of coronary artery disease feet she status post coronary artery bypass grafting in 2000 with stenting of the LAD in 2004. 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 Doing well .  Doing well.  Had her hip replaced 2 years ago and is doing well.  Doing lots of yard work without issues. No CP no dyspnea .   Nov. 14, 2016:  Doing well.  Lots of yard work .   No CP  Had CABG 16 years ago .  bp is a bit high here.    Nov. 15, 2017:  Erin Marshall is doing well.    No CP or dyspnea.     Lots of exercise,  Erin Marshall work this time of year    Current Outpatient Prescriptions on File Prior to Visit  Medication Sig Dispense Refill  . aspirin 81 MG tablet Take 81 mg by mouth daily.    Marland Kitchen atenolol (TENORMIN) 25 MG tablet TAKE 1 TABLET (25 MG TOTAL) BY MOUTH EVERY MORNING. 90 tablet 0  . atorvastatin (LIPITOR) 80 MG tablet TAKE 0.5 TABLETS (40 MG TOTAL) BY MOUTH EVERY MORNING. 45 tablet 2  . isosorbide mononitrate (IMDUR) 30 MG 24 hr tablet TAKE 1 TABLET (30 MG TOTAL) BY MOUTH EVERY MORNING. 90 tablet 3  . nitroGLYCERIN (NITROSTAT) 0.4 MG SL tablet Place 1 tablet (0.4 mg total) under  the tongue every 5 (five) minutes as needed. For chest pain 25 tablet 4  . valsartan-hydrochlorothiazide (DIOVAN-HCT) 80-12.5 MG tablet TAKE 1 TABLET BY MOUTH DAILY. 90 tablet 0   No current facility-administered medications on file prior to visit.     Allergies  Allergen Reactions  . Oxycodone-Acetaminophen Nausea And Vomiting  . Propoxyphene N-Acetaminophen Nausea And Vomiting    Past Medical History:  Diagnosis Date  . Arthritis 07-21-12   osteoarthritis left hip  . Coronary artery disease    Status post CABG, July 03, 1999  . Hyperlipidemia   . Hypertension   . Post PTCA    Status post PTCA and stenting of the proximal LAD-4 LIMA to LAD graft has become atretic after stenting of the proximal LAD , 2004    Past Surgical History:  Procedure Laterality Date  . CARDIAC CATHETERIZATION     Ejection Fraction is 60-65%  . CORONARY ARTERY BYPASS GRAFT  07-21-12   X4 vessel('00)/ '04 stent placed x1  . TOTAL HIP ARTHROPLASTY  07/28/2012   Procedure: TOTAL HIP ARTHROPLASTY;  Surgeon: Gearlean Alf, MD;  Location: [facility];  Service: Orthopedics;  Laterality: Left;    History  Smoking Status  .  Never Smoker  Smokeless Tobacco  . Not on file    History  Alcohol Use No    Family History  Problem Relation Age of Onset  . Heart failure Father   . Stroke Sister     Reviw of Systems:  Reviewed in the HPI.  All other systems are negative.  Physical Exam: BP (!) 146/90 (BP Location: Left Arm, Patient Position: Sitting, Cuff Size: Normal)   Pulse 60   Ht 5' 2.5" (1.588 m)   Wt 151 lb 3.2 oz (68.6 kg)   BMI 27.21 kg/m  The patient is alert and oriented x 3.  The mood and affect are normal.   Skin: warm and dry.  Color is normal.    HEENT:   the sclera are nonicteric.  The mucous membranes are moist.  The carotids are 2+ without bruits.  There is no thyromegaly.  There is no JVD.    Lungs: clear.  The chest wall is non tender.    Heart: regular rate with a normal S1 and  S2.  There are no murmurs, gallops, or rubs. The PMI is not displaced.     Abdomen: good bowel sounds.  There is no guarding or rebound.  There is no hepatosplenomegaly or tenderness.  There are no masses.   Extremities:  no clubbing, cyanosis, or edema.  The legs are without rashes.  The distal pulses are intact.   Neuro:  Cranial nerves II - XII are intact.  Motor and sensory functions are intact.    The gait is normal.  ECG:  11/15, 2017:  NSR at 60.   LBBB    Assessment / Plan:   1. CAD, CABG- July 2000-   She's not having any episodes of angina. Her blood pressure is well-controlled. I advised her to be careful about eating too much salt.     2. HTN-   BP is a bit elevated . We'll separate the valsartan HCT into the separate components. We'll continue valsartan 80 mg a day and we will increase the HCTZ to 25 mg a day. We'll have her return in 3 weeks for a basic medical profile.  3. Left hip relacement    Mertie Moores, MD  10/31/2016 11:27 AM    [facility] [place],  [place], [place]  [address] Pager [phone] Phone: [phone]; Fax: [phone]   [facility]  [address] [place], [place]  [address] [phone]   Fax [phone]

## 2016-11-01 NOTE — Addendum Note (Signed)
Addended by: Stephannie Peters on: 11/01/2016 03:59 PM   Modules accepted: Orders

## 2016-11-23 ENCOUNTER — Other Ambulatory Visit: Payer: Medicare Other | Admitting: *Deleted

## 2016-11-23 DIAGNOSIS — I10 Essential (primary) hypertension: Secondary | ICD-10-CM

## 2016-11-23 DIAGNOSIS — I2510 Atherosclerotic heart disease of native coronary artery without angina pectoris: Secondary | ICD-10-CM | POA: Diagnosis not present

## 2016-11-23 LAB — BASIC METABOLIC PANEL
BUN: 24 mg/dL (ref 7–25)
CALCIUM: 10.1 mg/dL (ref 8.6–10.4)
CHLORIDE: 104 mmol/L (ref 98–110)
CO2: 26 mmol/L (ref 20–31)
Creat: 0.91 mg/dL (ref 0.60–0.93)
GLUCOSE: 83 mg/dL (ref 65–99)
Potassium: 4.4 mmol/L (ref 3.5–5.3)
SODIUM: 140 mmol/L (ref 135–146)

## 2016-11-26 ENCOUNTER — Encounter: Payer: Self-pay | Admitting: Nurse Practitioner

## 2016-11-28 ENCOUNTER — Other Ambulatory Visit: Payer: Self-pay | Admitting: Cardiovascular Disease

## 2017-01-08 DIAGNOSIS — R413 Other amnesia: Secondary | ICD-10-CM | POA: Diagnosis not present

## 2017-01-08 DIAGNOSIS — B353 Tinea pedis: Secondary | ICD-10-CM | POA: Diagnosis not present

## 2017-01-10 ENCOUNTER — Other Ambulatory Visit: Payer: Self-pay | Admitting: Cardiovascular Disease

## 2017-01-10 NOTE — Telephone Encounter (Signed)
[facility] Reports   Date/Time Report Action User  10/31/2016 11:41 AM After Visit Summary Printed Emmaline Life, RN  Patient Instructions   Medication Instructions:  STOP Valsartan/HCTZ

## 2017-01-30 ENCOUNTER — Other Ambulatory Visit: Payer: Self-pay | Admitting: Cardiovascular Disease

## 2017-06-14 ENCOUNTER — Emergency Department (HOSPITAL_COMMUNITY): Payer: Medicare Other | Admitting: Anesthesiology

## 2017-06-14 ENCOUNTER — Emergency Department (HOSPITAL_COMMUNITY): Payer: Medicare Other

## 2017-06-14 ENCOUNTER — Encounter (HOSPITAL_COMMUNITY): Admission: EM | Disposition: A | Discharge status: Home / Self Care | Payer: Self-pay | Source: Home / Self Care

## 2017-06-14 ENCOUNTER — Encounter (HOSPITAL_COMMUNITY): Payer: Self-pay | Admitting: Emergency Medicine

## 2017-06-14 ENCOUNTER — Inpatient Hospital Stay (HOSPITAL_COMMUNITY)
Admission: EM | Admit: 2017-06-14 | Discharge: 2017-06-16 | DRG: 352 | Disposition: A | Discharge status: Home / Self Care | Payer: Medicare Other | Attending: Surgery | Admitting: Surgery

## 2017-06-14 DIAGNOSIS — Z823 Family history of stroke: Secondary | ICD-10-CM | POA: Diagnosis not present

## 2017-06-14 DIAGNOSIS — M169 Osteoarthritis of hip, unspecified: Secondary | ICD-10-CM | POA: Diagnosis not present

## 2017-06-14 DIAGNOSIS — Z955 Presence of coronary angioplasty implant and graft: Secondary | ICD-10-CM | POA: Diagnosis not present

## 2017-06-14 DIAGNOSIS — Z96642 Presence of left artificial hip joint: Secondary | ICD-10-CM | POA: Diagnosis present

## 2017-06-14 DIAGNOSIS — I10 Essential (primary) hypertension: Secondary | ICD-10-CM | POA: Diagnosis present

## 2017-06-14 DIAGNOSIS — E785 Hyperlipidemia, unspecified: Secondary | ICD-10-CM | POA: Diagnosis present

## 2017-06-14 DIAGNOSIS — Z79899 Other long term (current) drug therapy: Secondary | ICD-10-CM

## 2017-06-14 DIAGNOSIS — Z8249 Family history of ischemic heart disease and other diseases of the circulatory system: Secondary | ICD-10-CM

## 2017-06-14 DIAGNOSIS — Z7982 Long term (current) use of aspirin: Secondary | ICD-10-CM | POA: Diagnosis not present

## 2017-06-14 DIAGNOSIS — K4030 Unilateral inguinal hernia, with obstruction, without gangrene, not specified as recurrent: Principal | ICD-10-CM | POA: Diagnosis present

## 2017-06-14 DIAGNOSIS — K4090 Unilateral inguinal hernia, without obstruction or gangrene, not specified as recurrent: Secondary | ICD-10-CM | POA: Diagnosis not present

## 2017-06-14 DIAGNOSIS — I2510 Atherosclerotic heart disease of native coronary artery without angina pectoris: Secondary | ICD-10-CM | POA: Diagnosis present

## 2017-06-14 DIAGNOSIS — R109 Unspecified abdominal pain: Secondary | ICD-10-CM | POA: Diagnosis not present

## 2017-06-14 DIAGNOSIS — Z951 Presence of aortocoronary bypass graft: Secondary | ICD-10-CM | POA: Diagnosis not present

## 2017-06-14 HISTORY — PX: INGUINAL HERNIA REPAIR: SHX194

## 2017-06-14 LAB — URINALYSIS, ROUTINE W REFLEX MICROSCOPIC
Bilirubin Urine: NEGATIVE
Glucose, UA: NEGATIVE mg/dL
HGB URINE DIPSTICK: NEGATIVE
Ketones, ur: 5 mg/dL — AB
Nitrite: NEGATIVE
PROTEIN: 30 mg/dL — AB
SPECIFIC GRAVITY, URINE: 1.03 (ref 1.005–1.030)
pH: 5 (ref 5.0–8.0)

## 2017-06-14 LAB — CBC WITH DIFFERENTIAL/PLATELET
BASOS PCT: 0 %
Basophils Absolute: 0 10*3/uL (ref 0.0–0.1)
Eosinophils Absolute: 0.1 10*3/uL (ref 0.0–0.7)
Eosinophils Relative: 1 %
HEMATOCRIT: 37 % (ref 36.0–46.0)
Hemoglobin: 13.4 g/dL (ref 12.0–15.0)
LYMPHS ABS: 2.6 10*3/uL (ref 0.7–4.0)
Lymphocytes Relative: 29 %
MCH: 32.7 pg (ref 26.0–34.0)
MCHC: 36.2 g/dL — ABNORMAL HIGH (ref 30.0–36.0)
MCV: 90.2 fL (ref 78.0–100.0)
MONO ABS: 0.8 10*3/uL (ref 0.1–1.0)
MONOS PCT: 9 %
NEUTROS ABS: 5.5 10*3/uL (ref 1.7–7.7)
Neutrophils Relative %: 61 %
Platelets: 223 10*3/uL (ref 150–400)
RBC: 4.1 MIL/uL (ref 3.87–5.11)
RDW: 13.8 % (ref 11.5–15.5)
WBC: 9 10*3/uL (ref 4.0–10.5)

## 2017-06-14 LAB — COMPREHENSIVE METABOLIC PANEL
ALBUMIN: 4.7 g/dL (ref 3.5–5.0)
ALK PHOS: 36 U/L — AB (ref 38–126)
ALT: 28 U/L (ref 14–54)
ANION GAP: 15 (ref 5–15)
AST: 43 U/L — ABNORMAL HIGH (ref 15–41)
BILIRUBIN TOTAL: 0.9 mg/dL (ref 0.3–1.2)
BUN: 26 mg/dL — ABNORMAL HIGH (ref 6–20)
CALCIUM: 10.2 mg/dL (ref 8.9–10.3)
CO2: 19 mmol/L — ABNORMAL LOW (ref 22–32)
Chloride: 107 mmol/L (ref 101–111)
Creatinine, Ser: 1.07 mg/dL — ABNORMAL HIGH (ref 0.44–1.00)
GFR calc non Af Amer: 50 mL/min — ABNORMAL LOW (ref >60)
GFR, EST AFRICAN AMERICAN: 58 mL/min — AB (ref >60)
Glucose, Bld: 107 mg/dL — ABNORMAL HIGH (ref 65–99)
POTASSIUM: 3.6 mmol/L (ref 3.5–5.1)
Sodium: 141 mmol/L (ref 135–145)
TOTAL PROTEIN: 8.1 g/dL (ref 6.5–8.1)

## 2017-06-14 LAB — I-STAT CG4 LACTIC ACID, ED
LACTIC ACID, VENOUS: 2.5 mmol/L — AB (ref 0.5–1.9)
LACTIC ACID, VENOUS: 1.39 mmol/L (ref 0.5–1.9)

## 2017-06-14 LAB — LIPASE, BLOOD: Lipase: 63 U/L — ABNORMAL HIGH (ref 11–51)

## 2017-06-14 SURGERY — REPAIR, HERNIA, INGUINAL, LAPAROSCOPIC
Anesthesia: General | Site: Abdomen | Laterality: Right

## 2017-06-14 MED ORDER — BUPIVACAINE LIPOSOME 1.3 % IJ SUSP
20.0000 mL | Freq: Once | INTRAMUSCULAR | Status: DC
  Filled 2017-06-14: qty 20

## 2017-06-14 MED ORDER — FENTANYL CITRATE (PF) 100 MCG/2ML IJ SOLN
25.0000 ug | INTRAMUSCULAR | Status: DC | PRN
  Administered 2017-06-14: 50 ug via INTRAVENOUS

## 2017-06-14 MED ORDER — PROPOFOL 10 MG/ML IV BOLUS
INTRAVENOUS | Status: AC
  Filled 2017-06-14: qty 20

## 2017-06-14 MED ORDER — PROMETHAZINE HCL 25 MG/ML IJ SOLN: 6.2500 mg | INTRAMUSCULAR | Status: DC | PRN

## 2017-06-14 MED ORDER — ROCURONIUM BROMIDE 50 MG/5ML IV SOSY
PREFILLED_SYRINGE | INTRAVENOUS | Status: AC
  Filled 2017-06-14: qty 5

## 2017-06-14 MED ORDER — LACTATED RINGERS IR SOLN
Status: DC | PRN
  Administered 2017-06-14: 1000 mL

## 2017-06-14 MED ORDER — BUPIVACAINE-EPINEPHRINE (PF) 0.25% -1:200000 IJ SOLN
INTRAMUSCULAR | Status: AC
  Filled 2017-06-14: qty 30

## 2017-06-14 MED ORDER — IOPAMIDOL (ISOVUE-300) INJECTION 61%
100.0000 mL | Freq: Once | INTRAVENOUS | Status: AC | PRN
  Administered 2017-06-14: 100 mL via INTRAVENOUS

## 2017-06-14 MED ORDER — ONDANSETRON HCL 4 MG/2ML IJ SOLN
INTRAMUSCULAR | Status: DC | PRN
Start: 2017-06-14 — End: 2017-06-14
  Administered 2017-06-14: 4 mg via INTRAVENOUS

## 2017-06-14 MED ORDER — PROPOFOL 10 MG/ML IV BOLUS
INTRAVENOUS | Status: DC | PRN
  Administered 2017-06-14: 60 mg via INTRAVENOUS
  Administered 2017-06-14: 150 mg via INTRAVENOUS

## 2017-06-14 MED ORDER — LIDOCAINE HCL (CARDIAC) 20 MG/ML IV SOLN
INTRAVENOUS | Status: DC | PRN
  Administered 2017-06-14: 80 mg via INTRAVENOUS

## 2017-06-14 MED ORDER — SODIUM CHLORIDE 0.9 % IV BOLUS (SEPSIS)
1000.0000 mL | Freq: Once | INTRAVENOUS | Status: AC
  Administered 2017-06-14: 1000 mL via INTRAVENOUS

## 2017-06-14 MED ORDER — LACTATED RINGERS IV SOLN
INTRAVENOUS | Status: DC | PRN
  Administered 2017-06-14: 22:00:00 via INTRAVENOUS

## 2017-06-14 MED ORDER — HYDROMORPHONE HCL 1 MG/ML IJ SOLN
1.0000 mg | Freq: Once | INTRAMUSCULAR | Status: AC
Start: 2017-06-14 — End: 2017-06-14
  Administered 2017-06-14: 1 mg via INTRAVENOUS
  Filled 2017-06-14: qty 1

## 2017-06-14 MED ORDER — SUGAMMADEX SODIUM 200 MG/2ML IV SOLN
INTRAVENOUS | Status: AC
  Filled 2017-06-14: qty 2

## 2017-06-14 MED ORDER — BUPIVACAINE LIPOSOME 1.3 % IJ SUSP
INTRAMUSCULAR | Status: DC | PRN
  Administered 2017-06-14: 20 mL

## 2017-06-14 MED ORDER — HYDROMORPHONE HCL 1 MG/ML IJ SOLN
1.0000 mg | Freq: Once | INTRAMUSCULAR | Status: AC
  Administered 2017-06-14: 1 mg via INTRAVENOUS
  Filled 2017-06-14: qty 1

## 2017-06-14 MED ORDER — LIDOCAINE 2% (20 MG/ML) 5 ML SYRINGE
INTRAMUSCULAR | Status: AC
  Filled 2017-06-14: qty 5

## 2017-06-14 MED ORDER — CEFAZOLIN SODIUM-DEXTROSE 2-3 GM-% IV SOLR
INTRAVENOUS | Status: DC | PRN
  Administered 2017-06-14: 2 g via INTRAVENOUS

## 2017-06-14 MED ORDER — FENTANYL CITRATE (PF) 100 MCG/2ML IJ SOLN
INTRAMUSCULAR | Status: AC
  Filled 2017-06-14: qty 2

## 2017-06-14 MED ORDER — IOPAMIDOL (ISOVUE-300) INJECTION 61%
INTRAVENOUS | Status: AC
  Filled 2017-06-14: qty 100

## 2017-06-14 MED ORDER — DEXAMETHASONE SODIUM PHOSPHATE 4 MG/ML IJ SOLN
INTRAMUSCULAR | Status: DC | PRN
  Administered 2017-06-14: 10 mg via INTRAVENOUS

## 2017-06-14 MED ORDER — SUGAMMADEX SODIUM 200 MG/2ML IV SOLN
INTRAVENOUS | Status: DC | PRN
  Administered 2017-06-14: 200 mg via INTRAVENOUS

## 2017-06-14 MED ORDER — SUCCINYLCHOLINE CHLORIDE 200 MG/10ML IV SOSY
PREFILLED_SYRINGE | INTRAVENOUS | Status: AC
  Filled 2017-06-14: qty 10

## 2017-06-14 MED ORDER — CEFAZOLIN SODIUM-DEXTROSE 2-4 GM/100ML-% IV SOLN
INTRAVENOUS | Status: AC
  Filled 2017-06-14: qty 100

## 2017-06-14 MED ORDER — ONDANSETRON HCL 4 MG/2ML IJ SOLN
4.0000 mg | Freq: Once | INTRAMUSCULAR | Status: AC
  Administered 2017-06-14: 4 mg via INTRAVENOUS
  Filled 2017-06-14: qty 2

## 2017-06-14 MED ORDER — DEXAMETHASONE SODIUM PHOSPHATE 10 MG/ML IJ SOLN
INTRAMUSCULAR | Status: AC
  Filled 2017-06-14: qty 1

## 2017-06-14 MED ORDER — ROCURONIUM BROMIDE 100 MG/10ML IV SOLN
INTRAVENOUS | Status: DC | PRN
  Administered 2017-06-14: 30 mg via INTRAVENOUS

## 2017-06-14 MED ORDER — ONDANSETRON HCL 4 MG/2ML IJ SOLN
INTRAMUSCULAR | Status: AC
  Filled 2017-06-14: qty 2

## 2017-06-14 MED ORDER — SUCCINYLCHOLINE CHLORIDE 20 MG/ML IJ SOLN
INTRAMUSCULAR | Status: DC | PRN
  Administered 2017-06-14: 100 mg via INTRAVENOUS

## 2017-06-14 SURGICAL SUPPLY — 30 items
ADH SKN CLS APL DERMABOND .7 (GAUZE/BANDAGES/DRESSINGS) ×1
APL SKNCLS STERI-STRIP NONHPOA (GAUZE/BANDAGES/DRESSINGS) ×1
BENZOIN TINCTURE PRP APPL 2/3 (GAUZE/BANDAGES/DRESSINGS) ×3 IMPLANT
CABLE HIGH FREQUENCY MONO STRZ (ELECTRODE) ×3 IMPLANT
COVER SURGICAL LIGHT HANDLE (MISCELLANEOUS) ×3 IMPLANT
DECANTER SPIKE VIAL GLASS SM (MISCELLANEOUS) ×3 IMPLANT
DERMABOND ADVANCED (GAUZE/BANDAGES/DRESSINGS) ×2
DERMABOND ADVANCED .7 DNX12 (GAUZE/BANDAGES/DRESSINGS) IMPLANT
DEVICE SECURE STRAP 25 ABSORB (INSTRUMENTS) IMPLANT
DISSECT BALLN SPACEMKR + OVL (BALLOONS) ×3
DISSECTOR BALLN SPACEMKR + OVL (BALLOONS) ×1 IMPLANT
DISSECTOR BLUNT TIP ENDO 5MM (MISCELLANEOUS) IMPLANT
ELECT PENCIL ROCKER SW 15FT (MISCELLANEOUS) ×3 IMPLANT
ELECT REM PT RETURN 15FT ADLT (MISCELLANEOUS) ×3 IMPLANT
GLOVE BIOGEL M 8.0 STRL (GLOVE) ×3 IMPLANT
GOWN STRL REUS W/TWL XL LVL3 (GOWN DISPOSABLE) ×9 IMPLANT
IRRIG SUCT STRYKERFLOW 2 WTIP (MISCELLANEOUS)
IRRIGATION SUCT STRKRFLW 2 WTP (MISCELLANEOUS) IMPLANT
KIT BASIN OR (CUSTOM PROCEDURE TRAY) ×3 IMPLANT
PAD POSITIONING PINK XL (MISCELLANEOUS) IMPLANT
SCISSORS LAP 5X35 DISP (ENDOMECHANICALS) IMPLANT
SUT PROLENE 2 0 BLUE (SUTURE) ×2 IMPLANT
SUT VIC AB 3-0 SH 27 (SUTURE)
SUT VIC AB 3-0 SH 27XBRD (SUTURE) IMPLANT
SUT VIC AB 4-0 SH 18 (SUTURE) ×3 IMPLANT
SYR 30ML LL (SYRINGE) ×3 IMPLANT
TACKER 5MM HERNIA 3.5CML NAB (ENDOMECHANICALS) IMPLANT
TRAY FOLEY W/METER SILVER 16FR (SET/KITS/TRAYS/PACK) IMPLANT
TRAY LAPAROSCOPIC (CUSTOM PROCEDURE TRAY) ×3 IMPLANT
TUBING INSUF HEATED (TUBING) ×3 IMPLANT

## 2017-06-14 NOTE — H&P (Signed)
Chief Complaint:  Incarcerated right inguinal hernia  History of Present Illness:  Erin Marshall is an 71 y.o. female who had onset of pain today with nausea.  CT scan obtained in the [facility] ER showed small bowel in a new right inguinal hernia.  Informed consent was obtained regarding laparoscopy and open RIH repair.   Past Medical History:  Diagnosis Date  . Arthritis 07-21-12   osteoarthritis left hip  . Coronary artery disease    Status post CABG, July 03, 1999  . Hyperlipidemia   . Hypertension   . Post PTCA    Status post PTCA and stenting of the proximal LAD-4 LIMA to LAD graft has become atretic after stenting of the proximal LAD , 2004    Past Surgical History:  Procedure Laterality Date  . CARDIAC CATHETERIZATION     Ejection Fraction is 60-65%  . CORONARY ARTERY BYPASS GRAFT  07-21-12   X4 vessel('00)/ '04 stent placed x1  . TOTAL HIP ARTHROPLASTY  07/28/2012   Procedure: TOTAL HIP ARTHROPLASTY;  Surgeon: Gearlean Alf, MD;  Location: [facility] ORS;  Service: Orthopedics;  Laterality: Left;    Current Facility-Administered Medications  Medication Dose Route Frequency Provider Last Rate Last Dose  . iopamidol (ISOVUE-300) 61 % injection            Current Outpatient Prescriptions  Medication Sig Dispense Refill  . aspirin EC 81 MG tablet Take 81 mg by mouth daily.    Marland Kitchen atenolol (TENORMIN) 25 MG tablet Take 25 mg by mouth daily.    Marland Kitchen atorvastatin (LIPITOR) 80 MG tablet Take 40 mg by mouth daily.    . calcium-vitamin D (OSCAL WITH D) 500-200 MG-UNIT tablet Take 1 tablet by mouth daily.    . hydrochlorothiazide (HYDRODIURIL) 25 MG tablet Take 25 mg by mouth daily.    . isosorbide mononitrate (IMDUR) 30 MG 24 hr tablet Take 30 mg by mouth daily.    . nitroGLYCERIN (NITROSTAT) 0.4 MG SL tablet Place 0.4 mg under the tongue every 5 (five) minutes as needed for chest pain.    . Omega-3 Fatty Acids (FISH OIL PO) Take 1 capsule by mouth daily.    . valsartan (DIOVAN) 80 MG tablet Take 80 mg  by mouth daily.     Oxycodone-acetaminophen and Propoxyphene n-acetaminophen Family History  Problem Relation Age of Onset  . Heart failure Father   . Stroke Sister    Social History:   reports that she has never smoked. She does not have any smokeless tobacco history on file. She reports that she does not drink alcohol or use drugs.   REVIEW OF SYSTEMS : Negative except for see problem list  Physical Exam:   Blood pressure (!) 167/77, pulse 65, temperature 97.8 F (36.6 C), temperature source Oral, resp. rate 20, height '5\' 4"'  (1.626 m), SpO2 96 %. There is no height or weight on file to calculate BMI.  Gen:  WDWN WF some nausea  Neurological: Alert and oriented to person, place, and time. Motor and sensory function is grossly intact  Head: Normocephalic and atraumatic.  Eyes: Conjunctivae are normal. Pupils are equal, round, and reactive to light. No scleral icterus.  Neck: Normal range of motion. Neck supple. No tracheal deviation or thyromegaly present.  Cardiovascular:  SR without murmurs or gallops.  No carotid bruits Breast:  Not examined Respiratory: Effort normal.  No respiratory distress. No chest wall tenderness. Breath sounds normal.  No wheezes, rales or rhonchi.  Abdomen:  Flat and  nontender GU:  Egg sized incarcerated RIH noted Musculoskeletal: Normal range of motion. Extremities are nontender. No cyanosis, edema or clubbing noted Lymphadenopathy: No cervical, preauricular, postauricular or axillary adenopathy is present Skin: Skin is warm and dry. No rash noted. No diaphoresis. No erythema. No pallor. Pscyh: Normal mood and affect. Behavior is normal. Judgment and thought content normal.   LABORATORY RESULTS: Results for orders placed or performed during the hospital encounter of 06/14/17 (from the past 48 hour(s))  CBC with Differential     Status: Abnormal   Collection Time: 06/14/17  4:15 PM  Result Value Ref Range   WBC 9.0 4.0 - 10.5 K/uL   RBC 4.10 3.87 -  5.11 MIL/uL   Hemoglobin 13.4 12.0 - 15.0 g/dL   HCT 37.0 36.0 - 46.0 %   MCV 90.2 78.0 - 100.0 fL   MCH 32.7 26.0 - 34.0 pg   MCHC 36.2 (H) 30.0 - 36.0 g/dL   RDW 13.8 11.5 - 15.5 %   Platelets 223 150 - 400 K/uL   Neutrophils Relative % 61 %   Neutro Abs 5.5 1.7 - 7.7 K/uL   Lymphocytes Relative 29 %   Lymphs Abs 2.6 0.7 - 4.0 K/uL   Monocytes Relative 9 %   Monocytes Absolute 0.8 0.1 - 1.0 K/uL   Eosinophils Relative 1 %   Eosinophils Absolute 0.1 0.0 - 0.7 K/uL   Basophils Relative 0 %   Basophils Absolute 0.0 0.0 - 0.1 K/uL  Comprehensive metabolic panel     Status: Abnormal   Collection Time: 06/14/17  4:15 PM  Result Value Ref Range   Sodium 141 135 - 145 mmol/L   Potassium 3.6 3.5 - 5.1 mmol/L   Chloride 107 101 - 111 mmol/L   CO2 19 (L) 22 - 32 mmol/L   Glucose, Bld 107 (H) 65 - 99 mg/dL   BUN 26 (H) 6 - 20 mg/dL   Creatinine, Ser 1.07 (H) 0.44 - 1.00 mg/dL   Calcium 10.2 8.9 - 10.3 mg/dL   Total Protein 8.1 6.5 - 8.1 g/dL   Albumin 4.7 3.5 - 5.0 g/dL   AST 43 (H) 15 - 41 U/L   ALT 28 14 - 54 U/L   Alkaline Phosphatase 36 (L) 38 - 126 U/L   Total Bilirubin 0.9 0.3 - 1.2 mg/dL   GFR calc non Af Amer 50 (L) >60 mL/min   GFR calc Af Amer 58 (L) >60 mL/min    Comment: (NOTE) The eGFR has been calculated using the CKD EPI equation. This calculation has not been validated in all clinical situations. eGFR's persistently <60 mL/min signify possible Chronic Kidney Disease.    Anion gap 15 5 - 15  Lipase, blood     Status: Abnormal   Collection Time: 06/14/17  4:15 PM  Result Value Ref Range   Lipase 63 (H) 11 - 51 U/L  I-Stat CG4 Lactic Acid, ED     Status: Abnormal   Collection Time: 06/14/17  4:45 PM  Result Value Ref Range   Lactic Acid, Venous 2.50 (HH) 0.5 - 1.9 mmol/L   Comment NOTIFIED PHYSICIAN   Urinalysis, Routine w reflex microscopic     Status: Abnormal   Collection Time: 06/14/17  6:42 PM  Result Value Ref Range   Color, Urine AMBER (A) YELLOW     Comment: BIOCHEMICALS MAY BE AFFECTED BY COLOR   APPearance HAZY (A) CLEAR   Specific Gravity, Urine 1.030 1.005 - 1.030   pH 5.0 5.0 -  8.0   Glucose, UA NEGATIVE NEGATIVE mg/dL   Hgb urine dipstick NEGATIVE NEGATIVE   Bilirubin Urine NEGATIVE NEGATIVE   Ketones, ur 5 (A) NEGATIVE mg/dL   Protein, ur 30 (A) NEGATIVE mg/dL   Nitrite NEGATIVE NEGATIVE   Leukocytes, UA SMALL (A) NEGATIVE   RBC / HPF 0-5 0 - 5 RBC/hpf   WBC, UA 6-30 0 - 5 WBC/hpf   Bacteria, UA FEW (A) NONE SEEN   Squamous Epithelial / LPF 0-5 (A) NONE SEEN   Mucous PRESENT    Hyaline Casts, UA PRESENT    Ca Oxalate Crys, UA PRESENT   I-Stat CG4 Lactic Acid, ED     Status: None   Collection Time: 06/14/17  8:02 PM  Result Value Ref Range   Lactic Acid, Venous 1.39 0.5 - 1.9 mmol/L     RADIOLOGY RESULTS: Ct Abdomen Pelvis W Contrast  Result Date: 06/14/2017 CLINICAL DATA:  71 year old female with acute onset right groin pain while urinating. "Felt a pop" . Palpable right inguinal abnormality. EXAM: CT ABDOMEN AND PELVIS WITH CONTRAST TECHNIQUE: Multidetector CT imaging of the abdomen and pelvis was performed using the standard protocol following bolus administration of intravenous contrast. CONTRAST:  148m ISOVUE-300 IOPAMIDOL (ISOVUE-300) INJECTION 61% COMPARISON:  Pelvis radiograph 07/28/2012. FINDINGS: Lower chest: Mild lung base scarring. No pericardial or pleural effusion. Hepatobiliary: Hepatic steatosis. Cholelithiasis. No gallbladder inflammation or biliary ductal dilatation. Pancreas: Mildly prominent main pancreatic duct. Partial pancreatic atrophy. No pancreatic mass or lesion. Spleen: Negative. Adrenals/Urinary Tract: Normal adrenal glands. Bilateral renal enhancement and contrast excretion is within normal limits. Diminutive urinary bladder. Stomach/Bowel: 5 cm right inguinal hernia within incarcerated appearing distal small bowel loop. See coronal images 45-47. The loop is mildly dilated and fluid-filled, while  the upstream an downstream segments are compressed at the hernia neck. Trace free fluid also within the hernia sac. The immediate upstream looped within the abdomen is mildly dilated up to 22 mm. Other small bowel loops are decompressed throughout the abdomen. Negative stomach and duodenum. No intraabdominal free air or free fluid. Negative rectum. Mild to moderate sigmoid diverticulosis without active inflammation. Decompressed descending colon and transverse colon. Negative hepatic flexure, right colon, it retrocecal appendix. Vascular/Lymphatic: Calcified aortic atherosclerosis. Major arterial structures in the abdomen and pelvis are patent. Portal venous system is patent. No lymphadenopathy. Reproductive: Negative. Other: No pelvic free fluid. Musculoskeletal: Intermittent levels of advanced lumbar disc degeneration with vacuum disc. Chronic left hip arthroplasty. No acute osseous abnormality identified. IMPRESSION: 1. Incarcerated small bowel loop within a 5 cm right inguinal hernia. Probable early small bowel obstruction. Reactive appearing small volume of free fluid within the hernia sac. 2. No other acute or inflammatory process identified in the abdomen or pelvis. 3.  Calcified aortic atherosclerosis. 4. Cholelithiasis. 5. Sigmoid diverticulosis. Electronically Signed   By: HGenevie AnnM.D.   On: 06/14/2017 20:34    Problem List: Patient Active Problem List   Diagnosis Date Noted  . Essential hypertension 10/31/2016  . Hyperlipidemia 10/31/2015  . Postop Transfusion 07/31/2012  . Postop Acute blood loss anemia 07/30/2012  . Postop Hyponatremia 07/29/2012  . OA (osteoarthritis) of hip 07/28/2012  . CAD (coronary artery disease) 08/23/2011    Assessment & Plan: Incarcerated RIH To OR for reduction and repair.      Matt B. MHassell Done MD, FSacred [facility], [facility] [phone] 06/14/2017 9:52 PM

## 2017-06-14 NOTE — Anesthesia Preprocedure Evaluation (Addendum)
Anesthesia Evaluation  Patient identified by MRN, date of birth, ID band Patient awake    Reviewed: Allergy & Precautions, NPO status , Patient's Chart, lab work & pertinent test results, reviewed documented beta blocker date and time   Airway Mallampati: II  TM Distance: >3 FB Neck ROM: Full    Dental  (+) Dental Advisory Given   Pulmonary neg pulmonary ROS,    breath sounds clear to auscultation       Cardiovascular hypertension, Pt. on medications and Pt. on home beta blockers + CAD, + Cardiac Stents and + CABG   Rhythm:Regular Rate:Normal     Neuro/Psych negative neurological ROS     GI/Hepatic Incarcerated right inguinal hernia.   Endo/Other  negative endocrine ROS  Renal/GU negative Renal ROS     Musculoskeletal  (+) Arthritis ,   Abdominal   Peds  Hematology negative hematology ROS (+)   Anesthesia Other Findings   Reproductive/Obstetrics                            Lab Results  Component Value Date   WBC 9.0 06/14/2017   HGB 13.4 06/14/2017   HCT 37.0 06/14/2017   MCV 90.2 06/14/2017   PLT 223 06/14/2017   Lab Results  Component Value Date   CREATININE 1.07 (H) 06/14/2017   BUN 26 (H) 06/14/2017   NA 141 06/14/2017   K 3.6 06/14/2017   CL 107 06/14/2017   CO2 19 (L) 06/14/2017    Anesthesia Physical Anesthesia Plan  ASA: III and emergent  Anesthesia Plan: General   Post-op Pain Management:    Induction: Intravenous and Rapid sequence  PONV Risk Score and Plan: 4 or greater and Ondansetron, Dexamethasone, Propofol and Treatment may vary due to age or medical condition  Airway Management Planned: Oral ETT  Additional Equipment:   Intra-op Plan:   Post-operative Plan: Extubation in OR  Informed Consent: I have reviewed the patients History and Physical, chart, labs and discussed the procedure including the risks, benefits and alternatives for the proposed  anesthesia with the patient or authorized representative who has indicated his/her understanding and acceptance.   Dental advisory given  Plan Discussed with: CRNA  Anesthesia Plan Comments:        Anesthesia Quick Evaluation

## 2017-06-14 NOTE — ED Triage Notes (Signed)
Per EMS, Pt, from home, c/o R groin pain.  Pain score 5/10.  Pt reports she was urinating and "felt a pop."  Pt would not allow EMS to palpate area.  Sts "an egg shaped knot."  Pt able to ambulate w/o difficulty.  113mcg Fentanyl given en route.

## 2017-06-14 NOTE — ED Provider Notes (Signed)
[facility] DEPT Provider Note   CSN: [id number] Arrival date & time: 06/14/17  1543     History   Chief Complaint Chief Complaint  Patient presents with  . Groin Pain    HPI Erin Marshall is a 71 y.o. female.  HPI   Presents with acute onset of right groin pain. Reports she noticed it when she was urinating, had a bulge into her right groin, described as "an egg shaped knot". Reports pain is severe, 10 out of 10. Reports the pain is localized to the right groin with some mild radiation more proximally. Denies nausea, vomiting. Denies change in bowel movements. Denies history of hernia or intra-abdominal surgeries. No fevers.  Past Medical History:  Diagnosis Date  . Arthritis 07-21-12   osteoarthritis left hip  . Coronary artery disease    Status post CABG, July 03, 1999  . Hyperlipidemia   . Hypertension   . Post PTCA    Status post PTCA and stenting of the proximal LAD-4 LIMA to LAD graft has become atretic after stenting of the proximal LAD , 2004    Patient Active Problem List   Diagnosis Date Noted  . Incarcerated inguinal hernia, unilateral 06/14/2017  . Essential hypertension 10/31/2016  . Hyperlipidemia 10/31/2015  . Postop Transfusion 07/31/2012  . Postop Acute blood loss anemia 07/30/2012  . Postop Hyponatremia 07/29/2012  . OA (osteoarthritis) of hip 07/28/2012  . CAD (coronary artery disease) 08/23/2011    Past Surgical History:  Procedure Laterality Date  . CARDIAC CATHETERIZATION     Ejection Fraction is 60-65%  . CORONARY ARTERY BYPASS GRAFT  07-21-12   X4 vessel('00)/ '04 stent placed x1  . TOTAL HIP ARTHROPLASTY  07/28/2012   Procedure: TOTAL HIP ARTHROPLASTY;  Surgeon: Gearlean Alf, MD;  Location: [facility] OR[facility];  Service: Orthopedics;  Laterality: Left;    OB History    No data available       Home Medications    Prior to Admission medications   Medication Sig Start Date End Date Taking? Authorizing Provider  aspirin EC 81 MG tablet Take  81 mg by mouth daily.   Yes [provider]  atenolol (TENORMIN) 25 MG tablet Take 25 mg by mouth daily.   Yes [provider]  atorvastatin (LIPITOR) 80 MG tablet Take 40 mg by mouth daily.   Yes [provider]  calcium-vitamin D (OSCAL WITH D) 500-200 MG-UNIT tablet Take 1 tablet by mouth daily.   Yes [provider]  hydrochlorothiazide (HYDRODIURIL) 25 MG tablet Take 25 mg by mouth daily.   Yes [provider]  isosorbide mononitrate (IMDUR) 30 MG 24 hr tablet Take 30 mg by mouth daily.   Yes [provider]  nitroGLYCERIN (NITROSTAT) 0.4 MG SL tablet Place 0.4 mg under the tongue every 5 (five) minutes as needed for chest pain.   Yes [provider]  Omega-3 Fatty Acids (FISH OIL PO) Take 1 capsule by mouth daily.   Yes [provider]  valsartan (DIOVAN) 80 MG tablet Take 80 mg by mouth daily.   Yes [provider]    Family History Family History  Problem Relation Age of Onset  . Heart failure Father   . Stroke Sister     Social History Social History  Substance Use Topics  . Smoking status: Never Smoker  . Smokeless tobacco: Never Used  . Alcohol use No     Allergies   Oxycodone-acetaminophen and Propoxyphene n-acetaminophen   Review of Systems  Review of Systems  Constitutional: Negative for fever.  HENT: Negative for sore throat.   Eyes: Negative for visual disturbance.  Respiratory: Negative for cough and shortness of breath.   Cardiovascular: Negative for chest pain.  Gastrointestinal: Positive for abdominal pain. Negative for diarrhea, nausea and vomiting.  Genitourinary: Negative for difficulty urinating and dysuria.  Musculoskeletal: Negative for back pain and neck pain.  Skin: Negative for rash.  Neurological: Negative for syncope and headaches.     Physical Exam Updated Vital Signs BP (!) 165/76 (BP Location: Right Arm)   Pulse 73   Temp 97.7 F (36.5 C)   Resp 18    Ht 5\' 4"  (1.626 m)   SpO2 100%   Physical Exam  Constitutional: She is oriented to person, place, and time. She appears well-developed and well-nourished. She appears ill (in pain, anxious). No distress.  HENT:  Head: Normocephalic and atraumatic.  Eyes: Conjunctivae and EOM are normal.  Neck: Normal range of motion.  Cardiovascular: Normal rate, regular rhythm, normal heart sounds and intact distal pulses.  Exam reveals no gallop and no friction rub.   No murmur heard. Pulmonary/Chest: Effort normal and breath sounds normal. No respiratory distress. She has no wheezes. She has no rales.  Abdominal: Soft. She exhibits no distension. There is no tenderness. There is no guarding, no tenderness at McBurney's point and negative Murphy's sign. A hernia is present. Hernia confirmed positive in the right inguinal area (tenderness).  Musculoskeletal: She exhibits no edema or tenderness.  Neurological: She is alert and oriented to person, place, and time.  Skin: Skin is warm and dry. No rash noted. She is not diaphoretic. No erythema.  Nursing note and vitals reviewed.    ED Treatments / Results  Labs (all labs ordered are listed, but only abnormal results are displayed) Labs Reviewed  CBC WITH DIFFERENTIAL/PLATELET - Abnormal; Notable for the following:       Result Value   MCHC 36.2 (*)    All other components within normal limits  COMPREHENSIVE METABOLIC PANEL - Abnormal; Notable for the following:    CO2 19 (*)    Glucose, Bld 107 (*)    BUN 26 (*)    Creatinine, Ser 1.07 (*)    AST 43 (*)    Alkaline Phosphatase 36 (*)    GFR calc non Af Amer 50 (*)    GFR calc Af Amer 58 (*)    All other components within normal limits  LIPASE, BLOOD - Abnormal; Notable for the following:    Lipase 63 (*)    All other components within normal limits  URINALYSIS, ROUTINE W REFLEX MICROSCOPIC - Abnormal; Notable for the following:    Color, Urine AMBER (*)    APPearance HAZY (*)    Ketones, ur  5 (*)    Protein, ur 30 (*)    Leukocytes, UA SMALL (*)    Bacteria, UA FEW (*)    Squamous Epithelial / LPF 0-5 (*)    All other components within normal limits  I-STAT CG4 LACTIC ACID, ED - Abnormal; Notable for the following:    Lactic Acid, Venous 2.50 (*)    All other components within normal limits  CBC  BASIC METABOLIC PANEL  CBC  CREATININE, SERUM  I-STAT CG4 LACTIC ACID, ED    EKG  EKG Interpretation None       Radiology Ct Abdomen Pelvis W Contrast  Result Date: 06/14/2017 CLINICAL DATA:  71 year old female with acute onset right groin pain  while urinating. "Felt a pop" . Palpable right inguinal abnormality. EXAM: CT ABDOMEN AND PELVIS WITH CONTRAST TECHNIQUE: Multidetector CT imaging of the abdomen and pelvis was performed using the standard protocol following bolus administration of intravenous contrast. CONTRAST:  152mL ISOVUE-300 IOPAMIDOL (ISOVUE-300) INJECTION 61% COMPARISON:  Pelvis radiograph 07/28/2012. FINDINGS: Lower chest: Mild lung base scarring. No pericardial or pleural effusion. Hepatobiliary: Hepatic steatosis. Cholelithiasis. No gallbladder inflammation or biliary ductal dilatation. Pancreas: Mildly prominent main pancreatic duct. Partial pancreatic atrophy. No pancreatic mass or lesion. Spleen: Negative. Adrenals/Urinary Tract: Normal adrenal glands. Bilateral renal enhancement and contrast excretion is within normal limits. Diminutive urinary bladder. Stomach/Bowel: 5 cm right inguinal hernia within incarcerated appearing distal small bowel loop. See coronal images 45-47. The loop is mildly dilated and fluid-filled, while the upstream an downstream segments are compressed at the hernia neck. Trace free fluid also within the hernia sac. The immediate upstream looped within the abdomen is mildly dilated up to 22 mm. Other small bowel loops are decompressed throughout the abdomen. Negative stomach and duodenum. No intraabdominal free air or free fluid. Negative  rectum. Mild to moderate sigmoid diverticulosis without active inflammation. Decompressed descending colon and transverse colon. Negative hepatic flexure, right colon, it retrocecal appendix. Vascular/Lymphatic: Calcified aortic atherosclerosis. Major arterial structures in the abdomen and pelvis are patent. Portal venous system is patent. No lymphadenopathy. Reproductive: Negative. Other: No pelvic free fluid. Musculoskeletal: Intermittent levels of advanced lumbar disc degeneration with vacuum disc. Chronic left hip arthroplasty. No acute osseous abnormality identified. IMPRESSION: 1. Incarcerated small bowel loop within a 5 cm right inguinal hernia. Probable early small bowel obstruction. Reactive appearing small volume of free fluid within the hernia sac. 2. No other acute or inflammatory process identified in the abdomen or pelvis. 3.  Calcified aortic atherosclerosis. 4. Cholelithiasis. 5. Sigmoid diverticulosis. Electronically Signed   By: Genevie Ann M.D.   On: 06/14/2017 20:34    Procedures Procedures (including critical care time)  Medications Ordered in ED Medications  iopamidol (ISOVUE-300) 61 % injection (not administered)  ceFAZolin (ANCEF) 2-4 GM/100ML-% IVPB (not administered)  aspirin EC tablet 81 mg (not administered)  atenolol (TENORMIN) tablet 25 mg (not administered)  isosorbide mononitrate (IMDUR) 24 hr tablet 30 mg (not administered)  nitroGLYCERIN (NITROSTAT) SL tablet 0.4 mg (not administered)  heparin injection 5,000 Units (not administered)  fentaNYL (SUBLIMAZE) injection 12.5 mcg (not administered)  dextrose 5 % and 0.45 % NaCl with KCl 20 mEq/L infusion ( Intravenous New Bag/Given 06/15/17 0148)  ondansetron (ZOFRAN-ODT) disintegrating tablet 4 mg (not administered)    Or  ondansetron (ZOFRAN) injection 4 mg (not administered)  pantoprazole (PROTONIX) injection 40 mg (40 mg Intravenous Given 06/15/17 0149)  HYDROmorphone (DILAUDID) injection 1 mg (1 mg Intravenous Given  06/14/17 1626)  sodium chloride 0.9 % bolus 1,000 mL (0 mLs Intravenous Stopped 06/14/17 2047)  ondansetron (ZOFRAN) injection 4 mg (4 mg Intravenous Given 06/14/17 1626)  HYDROmorphone (DILAUDID) injection 1 mg (1 mg Intravenous Given 06/14/17 2047)  iopamidol (ISOVUE-300) 61 % injection 100 mL (100 mLs Intravenous Contrast Given 06/14/17 2008)     Initial Impression / Assessment and Plan / ED Course  I have reviewed the triage vital signs and the nursing notes.  Pertinent labs & imaging results that were available during my care of the patient were reviewed by me and considered in my medical decision making (see chart for details).     71 year old female with a history of hypertension, hyperlipidemia, coronary artery disease presents with concern for sudden  onset right groin pain. Patient with signs of incarcerated hernia on exam. Attempted to give Dilaudid and reduced without success. Given Dilaudid and Zofran. Consulted general surgery. CT abdomen and pelvis ordered with contrast showed incarcerated inguinal hernia containing small bowel.  Patient taken to OR for repair.  CRITICAL CAREincarcerated hernia Performed by: Alvino Chapel   Total critical care time: *40minutes  Critical care time was exclusive of separately billable procedures and treating other patients.  Critical care was necessary to treat or prevent imminent or life-threatening deterioration.  Critical care was time spent personally by me on the following activities: development of treatment plan with patient and/or surrogate as well as nursing, discussions with consultants, evaluation of patient's response to treatment, examination of patient, obtaining history from patient or surrogate, ordering and performing treatments and interventions, ordering and review of laboratory studies, ordering and review of radiographic studies, pulse oximetry and re-evaluation of patient's condition.   Final Clinical  Impressions(s) / ED Diagnoses   Final diagnoses:  Right inguinal hernia  Incarcerated right inguinal hernia    New Prescriptions Current Discharge Medication List       Gareth Morgan, MD 06/15/17 [phone]

## 2017-06-14 NOTE — Anesthesia Procedure Notes (Signed)
Procedure Name: Intubation Date/Time: 06/14/2017 10:19 PM Performed by: Claudia Desanctis Pre-anesthesia Checklist: Patient identified, Emergency Drugs available, Suction available and Patient being monitored Patient Re-evaluated:Patient Re-evaluated prior to inductionOxygen Delivery Method: Circle system utilized Preoxygenation: Pre-oxygenation with 100% oxygen Intubation Type: IV induction Ventilation: Mask ventilation without difficulty Laryngoscope Size: 2 and Miller Grade View: Grade I Tube type: Oral Tube size: 7.0 mm Number of attempts: 1 Airway Equipment and Method: Stylet Placement Confirmation: ETT inserted through vocal cords under direct vision,  positive ETCO2 and breath sounds checked- equal and bilateral Secured at: 21 cm Tube secured with: Tape Dental Injury: Teeth and Oropharynx as per pre-operative assessment

## 2017-06-14 NOTE — Op Note (Signed)
Surgeon: Kaylyn Lim, MD, FACS  Asst:  None   Anes:  Gen.  Preop Dx: Incarcerated right inguinal hernia Postop Dx: Incarcerated right direct inguinal hernia containing loops of small bowel  Procedure: Laparoscopy and reduction of incarcerated hernia and visualization of contused small bowel, open repair direct right inguinal hernia Location Surgery: R2 at [facility] Complications: None noted  EBL:   5 cc  Drains: None  Description of Procedure:  The patient was taken to OR 2 .  After anesthesia was administered and the patient was prepped a timeout was performed.  Foley catheter was inserted. The abdomen was prepped with technique care 100 sterilely. Access to the M was achieved to the left upper quadrant with a 5 mm Optiview without difficulty. Following insufflation of place a patient Trendelenburg and visualized for incarcerated right angle hernia. Place a second 5 balloon report on the left side. Through that inserted a glassman  and gently reduced the small intestine from incarceration. The bowel was contused but not infarcted.  Marland Kitchen Upon reduction it was noted to be somewhat contused but after studying it and after looking at it again at the end of the open repair and appeared viable was left alone.  There was a large hernia sac incarcerated up into this direct inguinal hernia. This was reduced into the abdomen and not opened.    I went ahead and made a transverse incision and then opened the external oblique I identified a Direct Hernia with a Narrow Neck. I Opened This up and within Reduced the Hernia and Could Visualize That with the Laparoscope. The Same Time I Closed This Defect from Medial to Lateral with a Running 2-0 Prolene. Because of the Translocation of Bacteria Possibly from the [place] like Placement of Mesh Was Not Best and I Elected to Close This Primarily. No Mesh Was Inserted. I Then Injected Exparel into All My Incisions. I Reinspected the Small Intestine.  Deflated the Abdomen. I Closed Wounds with 4-0 Vicryl and with Dermabond. The patient tolerated the procedure well and was taken to the PACU in stable condition.     Matt B. Hassell Done, [place], [facility], [place]

## 2017-06-14 NOTE — ED Notes (Signed)
Pt to OR. CHG wipes used to cleanse pt prior, consent on pt bed filled out as much as possible, and flowsheet completed

## 2017-06-14 NOTE — ED Notes (Signed)
Bed: WA06 Expected date:  Expected time:  Means of arrival:  Comments: EMS- 71yo F, groin pain

## 2017-06-14 NOTE — Transfer of Care (Signed)
Immediate Anesthesia Transfer of Care Note  Patient: Erin Marshall  Procedure(s) Performed: Procedure(s): LAPAROSCOPIC INGUINAL HERNIA (Right)  Patient Location: PACU  Anesthesia Type:General  Level of Consciousness:  sedated, patient cooperative and responds to stimulation  Airway & Oxygen Therapy:Patient Spontanous Breathing and Patient connected to face mask oxgen  Post-op Assessment:  Report given to PACU RN and Post -op Vital signs reviewed and stable  Post vital signs:  Reviewed and stable  Last Vitals:  Vitals:   06/14/17 1928 06/14/17 2000  BP: (!) 181/79 (!) 167/77  Pulse: 61 65  Resp: 20   Temp: 82.7 C     Complications: No apparent anesthesia complications

## 2017-06-14 NOTE — ED Notes (Signed)
Pt ambulated to and from restroom without assistance or difficulty.

## 2017-06-15 ENCOUNTER — Encounter (HOSPITAL_COMMUNITY): Payer: Self-pay

## 2017-06-15 LAB — BASIC METABOLIC PANEL
Anion gap: 7 (ref 5–15)
BUN: 23 mg/dL — AB (ref 6–20)
CHLORIDE: 108 mmol/L (ref 101–111)
CO2: 23 mmol/L (ref 22–32)
CREATININE: 0.78 mg/dL (ref 0.44–1.00)
Calcium: 9.3 mg/dL (ref 8.9–10.3)
GFR calc non Af Amer: >60 mL/min (ref >60)
Glucose, Bld: 145 mg/dL — ABNORMAL HIGH (ref 65–99)
Potassium: 4.1 mmol/L (ref 3.5–5.1)
SODIUM: 138 mmol/L (ref 135–145)

## 2017-06-15 LAB — CBC
HCT: 34.2 % — ABNORMAL LOW (ref 36.0–46.0)
HEMOGLOBIN: 11.9 g/dL — AB (ref 12.0–15.0)
MCH: 31.9 pg (ref 26.0–34.0)
MCHC: 34.8 g/dL (ref 30.0–36.0)
MCV: 91.7 fL (ref 78.0–100.0)
Platelets: 209 10*3/uL (ref 150–400)
RBC: 3.73 MIL/uL — ABNORMAL LOW (ref 3.87–5.11)
RDW: 14.4 % (ref 11.5–15.5)
WBC: 11 10*3/uL — ABNORMAL HIGH (ref 4.0–10.5)

## 2017-06-15 MED ORDER — DIPHENHYDRAMINE HCL 25 MG PO CAPS
25.0000 mg | ORAL_CAPSULE | Freq: Every evening | ORAL | Status: DC | PRN
  Administered 2017-06-15: 25 mg via ORAL
  Filled 2017-06-15: qty 1

## 2017-06-15 MED ORDER — ISOSORBIDE MONONITRATE ER 30 MG PO TB24
30.0000 mg | ORAL_TABLET | Freq: Every day | ORAL | Status: DC
  Administered 2017-06-15 – 2017-06-16 (×2): 30 mg via ORAL
  Filled 2017-06-15 (×2): qty 1

## 2017-06-15 MED ORDER — FENTANYL CITRATE (PF) 100 MCG/2ML IJ SOLN: 12.5000 ug | INTRAMUSCULAR | Status: DC | PRN

## 2017-06-15 MED ORDER — ACETAMINOPHEN 325 MG PO TABS
650.0000 mg | ORAL_TABLET | Freq: Four times a day (QID) | ORAL | Status: DC | PRN
  Administered 2017-06-15 – 2017-06-16 (×3): 650 mg via ORAL
  Filled 2017-06-15 (×2): qty 2

## 2017-06-15 MED ORDER — HEPARIN SODIUM (PORCINE) 5000 UNIT/ML IJ SOLN
5000.0000 [IU] | Freq: Three times a day (TID) | INTRAMUSCULAR | Status: DC
  Administered 2017-06-15 – 2017-06-16 (×4): 5000 [IU] via SUBCUTANEOUS
  Filled 2017-06-15 (×4): qty 1

## 2017-06-15 MED ORDER — ONDANSETRON 4 MG PO TBDP: 4.0000 mg | ORAL_TABLET | Freq: Four times a day (QID) | ORAL | Status: DC | PRN

## 2017-06-15 MED ORDER — ASPIRIN EC 81 MG PO TBEC
81.0000 mg | DELAYED_RELEASE_TABLET | Freq: Every day | ORAL | Status: DC
  Administered 2017-06-15 – 2017-06-16 (×2): 81 mg via ORAL
  Filled 2017-06-15 (×2): qty 1

## 2017-06-15 MED ORDER — PANTOPRAZOLE SODIUM 40 MG IV SOLR
40.0000 mg | Freq: Every day | INTRAVENOUS | Status: DC
  Administered 2017-06-15 (×2): 40 mg via INTRAVENOUS
  Filled 2017-06-15 (×2): qty 40

## 2017-06-15 MED ORDER — ATENOLOL 25 MG PO TABS
25.0000 mg | ORAL_TABLET | Freq: Every day | ORAL | Status: DC
Start: 2017-06-15 — End: 2017-06-16
  Administered 2017-06-15 – 2017-06-16 (×2): 25 mg via ORAL
  Filled 2017-06-15 (×2): qty 1

## 2017-06-15 MED ORDER — NITROGLYCERIN 0.4 MG SL SUBL: 0.4000 mg | SUBLINGUAL_TABLET | SUBLINGUAL | Status: DC | PRN

## 2017-06-15 MED ORDER — ONDANSETRON HCL 4 MG/2ML IJ SOLN: 4.0000 mg | Freq: Four times a day (QID) | INTRAMUSCULAR | Status: DC | PRN

## 2017-06-15 MED ORDER — KCL IN DEXTROSE-NACL 20-5-0.45 MEQ/L-%-% IV SOLN
INTRAVENOUS | Status: DC
  Administered 2017-06-15 (×3): via INTRAVENOUS
  Filled 2017-06-15 (×4): qty 1000

## 2017-06-15 MED ORDER — PNEUMOCOCCAL VAC POLYVALENT 25 MCG/0.5ML IJ INJ
0.5000 mL | INJECTION | INTRAMUSCULAR | Status: DC
  Filled 2017-06-15: qty 0.5

## 2017-06-15 NOTE — Progress Notes (Signed)
Late entry. Patient has small abrasion to left side of nose. Patient reports abrasion being there "for a while".

## 2017-06-15 NOTE — Anesthesia Postprocedure Evaluation (Signed)
Anesthesia Post Note  Patient: Erin Marshall  Procedure(s) Performed: Procedure(s) (LRB): LAPAROSCOPIC INGUINAL HERNIA (Right)     Patient location during evaluation: PACU Anesthesia Type: General Level of consciousness: awake and alert Pain management: pain level controlled Vital Signs Assessment: post-procedure vital signs reviewed and stable Respiratory status: spontaneous breathing, nonlabored ventilation, respiratory function stable and patient connected to nasal cannula oxygen Cardiovascular status: blood pressure returned to baseline and stable Postop Assessment: no signs of nausea or vomiting Anesthetic complications: no    Last Vitals:  Vitals:   06/14/17 2345 06/15/17 0000  BP: (!) 147/84 (!) 160/85  Pulse: 87 100  Resp: 12 17  Temp: 36.3 C     Last Pain:  Vitals:   06/14/17 2345  TempSrc:   PainSc: Tyler Deis

## 2017-06-15 NOTE — Progress Notes (Signed)
Patient ID: Erin Marshall, female   DOB: 03/08/1946, 71 y.o.   MRN: [id number] [facility] Progress Note:   1 Day Post-Op  Subjective: Mental status is clear. Objective: Vital signs in last 24 hours: Temp:  [97.4 F (36.3 C)-98.4 F (36.9 C)] 98.4 F (36.9 C) (06/30 1047) Pulse Rate:  [56-104] 56 (06/30 1047) Resp:  [12-24] 16 (06/30 1047) BP: (140-183)/(72-91) 140/72 (06/30 1047) SpO2:  [96 %-100 %] 99 % (06/30 1047) Weight:  [64 kg (141 lb 1.5 oz)] 64 kg (141 lb 1.5 oz) (06/30 0900)  Intake/Output from previous day: 06/29 0701 - 06/30 0700 In: 800 [I.V.:800] Out: 125 [Urine:100; Blood:25] Intake/Output this shift: No intake/output data recorded.  Physical Exam: Work of breathing is normal.  Incisions OK  Lab Results:  Results for orders placed or performed during the hospital encounter of 06/14/17 (from the past 48 hour(s))  CBC with Differential     Status: Abnormal   Collection Time: 06/14/17  4:15 PM  Result Value Ref Range   WBC 9.0 4.0 - 10.5 K/uL   RBC 4.10 3.87 - 5.11 MIL/uL   Hemoglobin 13.4 12.0 - 15.0 g/dL   HCT 37.0 36.0 - 46.0 %   MCV 90.2 78.0 - 100.0 fL   MCH 32.7 26.0 - 34.0 pg   MCHC 36.2 (H) 30.0 - 36.0 g/dL   RDW 13.8 11.5 - 15.5 %   Platelets 223 150 - 400 K/uL   Neutrophils Relative % 61 %   Neutro Abs 5.5 1.7 - 7.7 K/uL   Lymphocytes Relative 29 %   Lymphs Abs 2.6 0.7 - 4.0 K/uL   Monocytes Relative 9 %   Monocytes Absolute 0.8 0.1 - 1.0 K/uL   Eosinophils Relative 1 %   Eosinophils Absolute 0.1 0.0 - 0.7 K/uL   Basophils Relative 0 %   Basophils Absolute 0.0 0.0 - 0.1 K/uL  Comprehensive metabolic panel     Status: Abnormal   Collection Time: 06/14/17  4:15 PM  Result Value Ref Range   Sodium 141 135 - 145 mmol/L   Potassium 3.6 3.5 - 5.1 mmol/L   Chloride 107 101 - 111 mmol/L   CO2 19 (L) 22 - 32 mmol/L   Glucose, Bld 107 (H) 65 - 99 mg/dL   BUN 26 (H) 6 - 20 mg/dL   Creatinine, Ser 1.07 (H) 0.44 - 1.00 mg/dL   Calcium  10.2 8.9 - 10.3 mg/dL   Total Protein 8.1 6.5 - 8.1 g/dL   Albumin 4.7 3.5 - 5.0 g/dL   AST 43 (H) 15 - 41 U/L   ALT 28 14 - 54 U/L   Alkaline Phosphatase 36 (L) 38 - 126 U/L   Total Bilirubin 0.9 0.3 - 1.2 mg/dL   GFR calc non Af Amer 50 (L) >60 mL/min   GFR calc Af Amer 58 (L) >60 mL/min    Comment: (NOTE) The eGFR has been calculated using the CKD EPI equation. This calculation has not been validated in all clinical situations. eGFR's persistently <60 mL/min signify possible Chronic Kidney Disease.    Anion gap 15 5 - 15  Lipase, blood     Status: Abnormal   Collection Time: 06/14/17  4:15 PM  Result Value Ref Range   Lipase 63 (H) 11 - 51 U/L  I-Stat CG4 Lactic Acid, ED     Status: Abnormal   Collection Time: 06/14/17  4:45 PM  Result Value Ref Range   Lactic Acid, Venous 2.50 (HH) 0.5 -  1.9 mmol/L   Comment NOTIFIED PHYSICIAN   Urinalysis, Routine w reflex microscopic     Status: Abnormal   Collection Time: 06/14/17  6:42 PM  Result Value Ref Range   Color, Urine AMBER (A) YELLOW    Comment: BIOCHEMICALS MAY BE AFFECTED BY COLOR   APPearance HAZY (A) CLEAR   Specific Gravity, Urine 1.030 1.005 - 1.030   pH 5.0 5.0 - 8.0   Glucose, UA NEGATIVE NEGATIVE mg/dL   Hgb urine dipstick NEGATIVE NEGATIVE   Bilirubin Urine NEGATIVE NEGATIVE   Ketones, ur 5 (A) NEGATIVE mg/dL   Protein, ur 30 (A) NEGATIVE mg/dL   Nitrite NEGATIVE NEGATIVE   Leukocytes, UA SMALL (A) NEGATIVE   RBC / HPF 0-5 0 - 5 RBC/hpf   WBC, UA 6-30 0 - 5 WBC/hpf   Bacteria, UA FEW (A) NONE SEEN   Squamous Epithelial / LPF 0-5 (A) NONE SEEN   Mucous PRESENT    Hyaline Casts, UA PRESENT    Ca Oxalate Crys, UA PRESENT   I-Stat CG4 Lactic Acid, ED     Status: None   Collection Time: 06/14/17  8:02 PM  Result Value Ref Range   Lactic Acid, Venous 1.39 0.5 - 1.9 mmol/L  CBC     Status: Abnormal   Collection Time: 06/15/17  9:27 AM  Result Value Ref Range   WBC 11.0 (H) 4.0 - 10.5 K/uL   RBC 3.73 (L) 3.87  - 5.11 MIL/uL   Hemoglobin 11.9 (L) 12.0 - 15.0 g/dL   HCT 34.2 (L) 36.0 - 46.0 %   MCV 91.7 78.0 - 100.0 fL   MCH 31.9 26.0 - 34.0 pg   MCHC 34.8 30.0 - 36.0 g/dL   RDW 14.4 11.5 - 15.5 %   Platelets 209 150 - 400 K/uL  Basic metabolic panel     Status: Abnormal   Collection Time: 06/15/17  9:27 AM  Result Value Ref Range   Sodium 138 135 - 145 mmol/L   Potassium 4.1 3.5 - 5.1 mmol/L   Chloride 108 101 - 111 mmol/L   CO2 23 22 - 32 mmol/L   Glucose, Bld 145 (H) 65 - 99 mg/dL   BUN 23 (H) 6 - 20 mg/dL   Creatinine, Ser 0.78 0.44 - 1.00 mg/dL   Calcium 9.3 8.9 - 10.3 mg/dL   GFR calc non Af Amer >60 >60 mL/min   GFR calc Af Amer >60 >60 mL/min    Comment: (NOTE) The eGFR has been calculated using the CKD EPI equation. This calculation has not been validated in all clinical situations. eGFR's persistently <60 mL/min signify possible Chronic Kidney Disease.    Anion gap 7 5 - 15    Radiology/Results: Ct Abdomen Pelvis W Contrast  Result Date: 06/14/2017 CLINICAL DATA:  71 year old female with acute onset right groin pain while urinating. "Felt a pop" . Palpable right inguinal abnormality. EXAM: CT ABDOMEN AND PELVIS WITH CONTRAST TECHNIQUE: Multidetector CT imaging of the abdomen and pelvis was performed using the standard protocol following bolus administration of intravenous contrast. CONTRAST:  148m ISOVUE-300 IOPAMIDOL (ISOVUE-300) INJECTION 61% COMPARISON:  Pelvis radiograph 07/28/2012. FINDINGS: Lower chest: Mild lung base scarring. No pericardial or pleural effusion. Hepatobiliary: Hepatic steatosis. Cholelithiasis. No gallbladder inflammation or biliary ductal dilatation. Pancreas: Mildly prominent main pancreatic duct. Partial pancreatic atrophy. No pancreatic mass or lesion. Spleen: Negative. Adrenals/Urinary Tract: Normal adrenal glands. Bilateral renal enhancement and contrast excretion is within normal limits. Diminutive urinary bladder. Stomach/Bowel: 5 cm right inguinal  hernia within incarcerated appearing distal small bowel loop. See coronal images 45-47. The loop is mildly dilated and fluid-filled, while the upstream an downstream segments are compressed at the hernia neck. Trace free fluid also within the hernia sac. The immediate upstream looped within the abdomen is mildly dilated up to 22 mm. Other small bowel loops are decompressed throughout the abdomen. Negative stomach and duodenum. No intraabdominal free air or free fluid. Negative rectum. Mild to moderate sigmoid diverticulosis without active inflammation. Decompressed descending colon and transverse colon. Negative hepatic flexure, right colon, it retrocecal appendix. Vascular/Lymphatic: Calcified aortic atherosclerosis. Major arterial structures in the abdomen and pelvis are patent. Portal venous system is patent. No lymphadenopathy. Reproductive: Negative. Other: No pelvic free fluid. Musculoskeletal: Intermittent levels of advanced lumbar disc degeneration with vacuum disc. Chronic left hip arthroplasty. No acute osseous abnormality identified. IMPRESSION: 1. Incarcerated small bowel loop within a 5 cm right inguinal hernia. Probable early small bowel obstruction. Reactive appearing small volume of free fluid within the hernia sac. 2. No other acute or inflammatory process identified in the abdomen or pelvis. 3.  Calcified aortic atherosclerosis. 4. Cholelithiasis. 5. Sigmoid diverticulosis. Electronically Signed   By: Genevie Ann M.D.   On: 06/14/2017 20:34    Anti-infectives: Anti-infectives    Start     Dose/Rate Route Frequency Ordered Stop   06/14/17 2207  ceFAZolin (ANCEF) 2-4 GM/100ML-% IVPB    Comments:  Dellie Catholic   : cabinet override      06/14/17 2207 06/15/17 1014      Assessment/Plan: Problem List: Patient Active Problem List   Diagnosis Date Noted  . Incarcerated inguinal hernia, unilateral 06/14/2017  . Essential hypertension 10/31/2016  . Hyperlipidemia 10/31/2015  . Postop  Transfusion 07/31/2012  . Postop Acute blood loss anemia 07/30/2012  . Postop Hyponatremia 07/29/2012  . OA (osteoarthritis) of hip 07/28/2012  . CAD (coronary artery disease) 08/23/2011    Will start clears and see how she tolerates.   1 Day Post-Op    LOS: 1 day   Matt B. Hassell Done, MD, [facility] [phone] beeper [phone]  06/15/2017 12:12 PM

## 2017-06-15 NOTE — Progress Notes (Signed)
Patient in PACU , woke confused and gradually more oriented, making comments that were difficult to understand. Her son and daughter came to bedside and stated she had been having short term memory issues for about 2 years. Patient also had experienced a sudden loss of her husband one month ago. Gradually she was more appropriate but remained confused to time of day and needed redirection.

## 2017-06-15 NOTE — Progress Notes (Signed)
Pt family concerned about pt mental status. In acute setting intermittent confusion has been observed, disorientation.  Family states this change is observed at home but not as severe, wanting further follow up. Will continue to monitor. Kizzie Ide, RN

## 2017-06-15 NOTE — Progress Notes (Signed)
Patient requesting medication to help with sleep. Patient reports not doing well with sleep aids. Patient usually takes Tylenol PM for sleep and reports Benadryl helping with sleep if Tylenol PM is not available. Nurse paged on-call provider, Dr. Marlou Starks, waiting for return call or orders to be placed.

## 2017-06-15 NOTE — Progress Notes (Signed)
Nurse called provider to question patient needing telemetry due to cardiac history and vital signs as follows: BP 177/91, P 84, T 97.9, R15, 100% on 2LO2 in PACU. Nurse also informed provider of PACU reporting patient is in normal sinus rhythm with bundle branch block. Per Dr. Zigmund Daniel patient can come up to regular medical-surgical floor and does not need telemetry at this time.

## 2017-06-16 LAB — CBC
HCT: 31.8 % — ABNORMAL LOW (ref 36.0–46.0)
Hemoglobin: 10.7 g/dL — ABNORMAL LOW (ref 12.0–15.0)
MCH: 31.3 pg (ref 26.0–34.0)
MCHC: 33.6 g/dL (ref 30.0–36.0)
MCV: 93 fL (ref 78.0–100.0)
PLATELETS: 183 10*3/uL (ref 150–400)
RBC: 3.42 MIL/uL — ABNORMAL LOW (ref 3.87–5.11)
RDW: 14.8 % (ref 11.5–15.5)
WBC: 10.3 10*3/uL (ref 4.0–10.5)

## 2017-06-16 LAB — BASIC METABOLIC PANEL
ANION GAP: 4 — AB (ref 5–15)
BUN: 15 mg/dL (ref 6–20)
CALCIUM: 8.9 mg/dL (ref 8.9–10.3)
CO2: 25 mmol/L (ref 22–32)
CREATININE: 0.79 mg/dL (ref 0.44–1.00)
Chloride: 112 mmol/L — ABNORMAL HIGH (ref 101–111)
GFR calc Af Amer: >60 mL/min (ref >60)
Glucose, Bld: 204 mg/dL — ABNORMAL HIGH (ref 65–99)
Potassium: 5.8 mmol/L — ABNORMAL HIGH (ref 3.5–5.1)
Sodium: 141 mmol/L (ref 135–145)

## 2017-06-16 NOTE — Discharge Summary (Signed)
Physician Discharge Summary  Patient ID: Erin Marshall MRN: [id number] DOB/AGE: Jul 11, 1946 71 y.o.  Admit date: 06/14/2017 Discharge date: 06/16/2017  Admission Diagnoses:  Incarcerated right inguinal hernia  Discharge Diagnoses:  same  Active Problems:   Incarcerated inguinal hernia, unilateral   Surgery:  Laparoscopy and open right inguinal hernia repair without mesh  Discharged Condition: improved  Hospital Course:   Had emergency surgery on Friday night.  Begun on clear liquids Saturday which she tolerated.  Postop labs ok.  Feeling well and ready for discharge on Sunday.  Consults: none  Significant Diagnostic Studies: none    Discharge Exam: Blood pressure (!) 170/81, pulse (!) 53, temperature 98.7 F (37.1 C), temperature source Oral, resp. rate 16, height 5\' 4"  (1.626 m), weight 64 kg (141 lb 1.5 oz), SpO2 98 %. Incisions OK-left upper quadrant is more sore but no evidence of infection  Disposition: 06-Home-Health Care Svc  Discharge Instructions    Call MD for:  persistant nausea and vomiting    Complete by:  As directed    Call MD for:  redness, tenderness, or signs of infection (pain, swelling, redness, odor or green/yellow discharge around incision site)    Complete by:  As directed    Call MD for:  severe uncontrolled pain    Complete by:  As directed    Diet - low sodium heart healthy    Complete by:  As directed    Discharge instructions    Complete by:  As directed    May shower and get incisions wet.  The Dermabond that closes the incisions will peel off over time.   Stay on liquid/soft foods for a few days and gradually add solids to your diet.   Increase activity slowly    Complete by:  As directed      Allergies as of 06/16/2017      Reactions   Oxycodone-acetaminophen Nausea And Vomiting   Propoxyphene N-acetaminophen Nausea And Vomiting      Medication List    TAKE these medications   aspirin EC 81 MG tablet Take 81 mg by mouth daily.    atenolol 25 MG tablet Commonly known as:  TENORMIN Take 25 mg by mouth daily.   atorvastatin 80 MG tablet Commonly known as:  LIPITOR Take 40 mg by mouth daily.   calcium-vitamin D 500-200 MG-UNIT tablet Commonly known as:  OSCAL WITH D Take 1 tablet by mouth daily.   FISH OIL PO Take 1 capsule by mouth daily.   hydrochlorothiazide 25 MG tablet Commonly known as:  HYDRODIURIL Take 25 mg by mouth daily.   isosorbide mononitrate 30 MG 24 hr tablet Commonly known as:  IMDUR Take 30 mg by mouth daily.   nitroGLYCERIN 0.4 MG SL tablet Commonly known as:  NITROSTAT Place 0.4 mg under the tongue every 5 (five) minutes as needed for chest pain.   valsartan 80 MG tablet Commonly known as:  DIOVAN Take 80 mg by mouth daily.      Follow-up Information    Johnathan Hausen, MD. Schedule an appointment as soon as possible for a visit in 4 week(s).   Specialty:  General Surgery Contact information: [address] [phone]           Signed: Pedro Earls 06/16/2017, 8:18 AM

## 2017-06-16 NOTE — Progress Notes (Signed)
Discharged from floor via w/c for transport home by car. Belongings & daughter with pt. No changes in assessment. Erin Marshall  

## 2017-06-16 NOTE — Discharge Instructions (Signed)
Open Hernia Repair, Adult, Care After °These instructions give you information about caring for yourself after your procedure. Your doctor may also give you more specific instructions. If you have problems or questions, contact your doctor. °Follow these instructions at home: °Surgical cut (incision) care  ° °· Follow instructions from your doctor about how to take care of your surgical cut area. Make sure you: °¨ Wash your hands with soap and water before you change your bandage (dressing). If you cannot use soap and water, use hand sanitizer. °¨ Change your bandage as told by your doctor. °¨ Leave stitches (sutures), skin glue, or skin tape (adhesive) strips in place. They may need to stay in place for 2 weeks or longer. If tape strips get loose and curl up, you may trim the loose edges. Do not remove tape strips completely unless your doctor says it is okay. °· Check your surgical cut every day for signs of infection. Check for: °¨ More redness, swelling, or pain. °¨ More fluid or blood. °¨ Warmth. °¨ Pus or a bad smell. °Activity  °· Do not drive or use heavy machinery while taking prescription pain medicine. Do not drive until your doctor says it is okay. °· Until your doctor says it is okay: °¨ Do not lift anything that is heavier than 10 lb (4.5 kg). °¨ Do not play contact sports. °· Return to your normal activities as told by your doctor. Ask your doctor what activities are safe. °General instructions  °· To prevent or treat having a hard time pooping (constipation) while you are taking prescription pain medicine, your doctor may recommend that you: °¨ Drink enough fluid to keep your pee (urine) clear or pale yellow. °¨ Take over-the-counter or prescription medicines. °¨ Eat foods that are high in fiber, such as fresh fruits and vegetables, whole grains, and beans. °¨ Limit foods that are high in fat and processed sugars, such as fried and sweet foods. °· Take over-the-counter and prescription medicines only  as told by your doctor. °· Do not take baths, swim, or use a hot tub until your doctor says it is okay. °· Keep all follow-up visits as told by your doctor. This is important. °Contact a doctor if: °· You develop a rash. °· You have more redness, swelling, or pain around your surgical cut. °· You have more fluid or blood coming from your surgical cut. °· Your surgical cut feels warm to the touch. °· You have pus or a bad smell coming from your surgical cut. °· You have a fever or chills. °· You have blood in your poop (stool). °· You have not pooped in 2-3 days. °· Medicine does not help your pain. °Get help right away if: °· You have chest pain or you are short of breath. °· You feel light-headed. °· You feel weak and dizzy (feel faint). °· You have very bad pain. °· You throw up (vomit) and your pain is worse. °This information is not intended to replace advice given to you by your health care provider. Make sure you discuss any questions you have with your health care provider. °Document Released: 12/24/2014 Document Revised: 06/22/2016 Document Reviewed: 05/16/2016 °Elsevier Interactive Patient Education © 2017 [employer] ° °

## 2017-08-01 ENCOUNTER — Telehealth: Payer: Self-pay | Admitting: Pharmacist

## 2017-08-01 MED ORDER — IRBESARTAN 75 MG PO TABS: 75.0000 mg | ORAL_TABLET | Freq: Every day | ORAL | 1 refills | Status: DC

## 2017-08-01 MED ORDER — IRBESARTAN 75 MG PO TABS
75.0000 mg | ORAL_TABLET | Freq: Every day | ORAL | 1 refills | Status: DC
Start: 2017-08-01 — End: 2018-01-22

## 2017-08-01 NOTE — Telephone Encounter (Signed)
Fax from pharmacy requesting change of valsartan due to backorder/recall.   Spoke with patient advised of change. Pt on valsartan for HTN will change to irbesartan 75mg  daily. Advised to monitor pressures several times over next 3-4 weeks and call with any questions or changes.  Pt is moving to [place], [place] and requesting recommendation for new cardiologist in that area. Will route to Dr. Acie Fredrickson and nurse for recommendation.

## 2017-08-01 NOTE — Telephone Encounter (Signed)
New message    Pt c/o medication issue:  1. Name of Medication: Valsartan   2. How are you currently taking this medication (dosage and times per day)? 80 mg  3. Are you having a reaction (difficulty breathing--STAT)? no  4. What is your medication issue? Med is recalled and pharmacy needs to know what to give pt.

## 2017-08-02 NOTE — Telephone Encounter (Signed)
I would recommend [facility] in [place], [place]   Address: [address], [place], [place] [address]    Phone: [phone]

## 2017-08-02 NOTE — Telephone Encounter (Signed)
Spoke with patient and advised her of Dr. Elmarie Shiley recommendation on a cardiologist in her area. She thanked me for the call and I wished her well and advised her to call back with any additional questions or concerns.

## 2017-09-06 DIAGNOSIS — I2510 Atherosclerotic heart disease of native coronary artery without angina pectoris: Secondary | ICD-10-CM | POA: Diagnosis not present

## 2017-09-06 DIAGNOSIS — E785 Hyperlipidemia, unspecified: Secondary | ICD-10-CM | POA: Diagnosis not present

## 2017-09-06 DIAGNOSIS — R413 Other amnesia: Secondary | ICD-10-CM | POA: Diagnosis not present

## 2017-09-06 DIAGNOSIS — I10 Essential (primary) hypertension: Secondary | ICD-10-CM | POA: Diagnosis not present

## 2017-09-06 DIAGNOSIS — R5383 Other fatigue: Secondary | ICD-10-CM | POA: Diagnosis not present

## 2017-09-06 DIAGNOSIS — Z23 Encounter for immunization: Secondary | ICD-10-CM | POA: Diagnosis not present

## 2017-09-13 DIAGNOSIS — E785 Hyperlipidemia, unspecified: Secondary | ICD-10-CM | POA: Diagnosis not present

## 2017-09-13 DIAGNOSIS — I10 Essential (primary) hypertension: Secondary | ICD-10-CM | POA: Diagnosis not present

## 2017-09-13 DIAGNOSIS — I2510 Atherosclerotic heart disease of native coronary artery without angina pectoris: Secondary | ICD-10-CM | POA: Diagnosis not present

## 2017-09-17 DIAGNOSIS — L570 Actinic keratosis: Secondary | ICD-10-CM | POA: Diagnosis not present

## 2017-09-17 DIAGNOSIS — L814 Other melanin hyperpigmentation: Secondary | ICD-10-CM | POA: Diagnosis not present

## 2017-09-17 DIAGNOSIS — D0439 Carcinoma in situ of skin of other parts of face: Secondary | ICD-10-CM | POA: Diagnosis not present

## 2017-09-17 DIAGNOSIS — D485 Neoplasm of uncertain behavior of skin: Secondary | ICD-10-CM | POA: Diagnosis not present

## 2017-09-17 DIAGNOSIS — L821 Other seborrheic keratosis: Secondary | ICD-10-CM | POA: Diagnosis not present

## 2017-10-04 DIAGNOSIS — I10 Essential (primary) hypertension: Secondary | ICD-10-CM | POA: Diagnosis not present

## 2017-10-04 DIAGNOSIS — G4700 Insomnia, unspecified: Secondary | ICD-10-CM | POA: Diagnosis not present

## 2017-10-04 DIAGNOSIS — R413 Other amnesia: Secondary | ICD-10-CM | POA: Diagnosis not present

## 2017-10-04 DIAGNOSIS — Z23 Encounter for immunization: Secondary | ICD-10-CM | POA: Diagnosis not present

## 2017-10-04 DIAGNOSIS — I2510 Atherosclerotic heart disease of native coronary artery without angina pectoris: Secondary | ICD-10-CM | POA: Diagnosis not present

## 2017-10-11 DIAGNOSIS — L570 Actinic keratosis: Secondary | ICD-10-CM | POA: Diagnosis not present

## 2017-10-11 DIAGNOSIS — D0439 Carcinoma in situ of skin of other parts of face: Secondary | ICD-10-CM | POA: Diagnosis not present

## 2017-10-23 ENCOUNTER — Other Ambulatory Visit: Payer: Self-pay | Admitting: Cardiovascular Disease

## 2017-10-25 DIAGNOSIS — R413 Other amnesia: Secondary | ICD-10-CM | POA: Diagnosis not present

## 2017-10-28 ENCOUNTER — Other Ambulatory Visit: Payer: Self-pay | Admitting: Cardiovascular Disease

## 2017-11-29 DIAGNOSIS — D0439 Carcinoma in situ of skin of other parts of face: Secondary | ICD-10-CM | POA: Diagnosis not present

## 2017-12-20 DIAGNOSIS — H538 Other visual disturbances: Secondary | ICD-10-CM | POA: Diagnosis not present

## 2017-12-20 DIAGNOSIS — H25813 Combined forms of age-related cataract, bilateral: Secondary | ICD-10-CM | POA: Diagnosis not present

## 2017-12-20 DIAGNOSIS — H52203 Unspecified astigmatism, bilateral: Secondary | ICD-10-CM | POA: Diagnosis not present

## 2017-12-20 DIAGNOSIS — H5213 Myopia, bilateral: Secondary | ICD-10-CM | POA: Diagnosis not present

## 2017-12-20 DIAGNOSIS — H524 Presbyopia: Secondary | ICD-10-CM | POA: Diagnosis not present

## 2018-01-22 ENCOUNTER — Other Ambulatory Visit: Payer: Self-pay | Admitting: Cardiovascular Disease

## 2018-01-26 ENCOUNTER — Other Ambulatory Visit: Payer: Self-pay | Admitting: Cardiovascular Disease

## 2018-01-27 DIAGNOSIS — L821 Other seborrheic keratosis: Secondary | ICD-10-CM | POA: Diagnosis not present

## 2018-01-27 DIAGNOSIS — Z85828 Personal history of other malignant neoplasm of skin: Secondary | ICD-10-CM | POA: Diagnosis not present

## 2018-01-27 DIAGNOSIS — L814 Other melanin hyperpigmentation: Secondary | ICD-10-CM | POA: Diagnosis not present

## 2018-01-27 DIAGNOSIS — Z08 Encounter for follow-up examination after completed treatment for malignant neoplasm: Secondary | ICD-10-CM | POA: Diagnosis not present

## 2018-02-14 DIAGNOSIS — I10 Essential (primary) hypertension: Secondary | ICD-10-CM | POA: Diagnosis not present

## 2018-02-14 DIAGNOSIS — G4700 Insomnia, unspecified: Secondary | ICD-10-CM | POA: Diagnosis not present

## 2018-02-14 DIAGNOSIS — R413 Other amnesia: Secondary | ICD-10-CM | POA: Diagnosis not present

## 2018-02-14 DIAGNOSIS — M1710 Unilateral primary osteoarthritis, unspecified knee: Secondary | ICD-10-CM | POA: Diagnosis not present

## 2018-03-12 ENCOUNTER — Other Ambulatory Visit: Payer: Self-pay | Admitting: Cardiovascular Disease

## 2018-04-19 ENCOUNTER — Other Ambulatory Visit: Payer: Self-pay | Admitting: Cardiovascular Disease
